# Patient Record
Sex: Male | Born: 1970
Health system: Southern US, Community
[De-identification: ages and names within clinical notes are randomized; demographics above are authoritative.]

## PROBLEM LIST (undated history)

## (undated) DIAGNOSIS — R569 Unspecified convulsions: Secondary | ICD-10-CM

## (undated) DIAGNOSIS — Z789 Other specified health status: Secondary | ICD-10-CM

## (undated) DIAGNOSIS — R011 Cardiac murmur, unspecified: Secondary | ICD-10-CM

## (undated) DIAGNOSIS — F4310 Post-traumatic stress disorder, unspecified: Secondary | ICD-10-CM

## (undated) DIAGNOSIS — F32A Depression, unspecified: Secondary | ICD-10-CM

## (undated) DIAGNOSIS — K5090 Crohn's disease, unspecified, without complications: Secondary | ICD-10-CM

## (undated) HISTORY — PX: CHOLECYSTECTOMY: SHX55

## (undated) HISTORY — PX: KNEE SURGERY: SHX244

## (undated) HISTORY — PX: ABDOMINAL SURGERY: SHX537

## (undated) HISTORY — PX: OTHER SURGICAL HISTORY: SHX169

---

## 1998-02-16 ENCOUNTER — Emergency Department (HOSPITAL_COMMUNITY): Admission: EM | Admit: 1998-02-16 | Discharge: 1998-02-16 | Payer: Self-pay | Admitting: Emergency Medicine

## 1998-02-16 ENCOUNTER — Encounter: Payer: Self-pay | Admitting: Emergency Medicine

## 2001-04-25 ENCOUNTER — Emergency Department (HOSPITAL_COMMUNITY): Admission: EM | Admit: 2001-04-25 | Discharge: 2001-04-25 | Payer: Self-pay | Admitting: *Deleted

## 2002-12-02 ENCOUNTER — Encounter: Payer: Self-pay | Admitting: Emergency Medicine

## 2002-12-02 ENCOUNTER — Emergency Department (HOSPITAL_COMMUNITY): Admission: EM | Admit: 2002-12-02 | Discharge: 2002-12-02 | Payer: Self-pay | Admitting: Emergency Medicine

## 2004-09-28 ENCOUNTER — Emergency Department (HOSPITAL_COMMUNITY): Admission: EM | Admit: 2004-09-28 | Discharge: 2004-09-29 | Payer: Self-pay | Admitting: Emergency Medicine

## 2005-05-29 ENCOUNTER — Emergency Department (HOSPITAL_COMMUNITY): Admission: EM | Admit: 2005-05-29 | Discharge: 2005-05-29 | Payer: Self-pay | Admitting: Emergency Medicine

## 2005-06-17 ENCOUNTER — Ambulatory Visit: Payer: Self-pay | Admitting: Family Medicine

## 2005-07-16 ENCOUNTER — Ambulatory Visit: Payer: Self-pay | Admitting: Family Medicine

## 2005-08-15 ENCOUNTER — Ambulatory Visit: Payer: Self-pay | Admitting: Family Medicine

## 2005-09-18 ENCOUNTER — Ambulatory Visit: Payer: Self-pay | Admitting: Family Medicine

## 2005-10-20 ENCOUNTER — Ambulatory Visit: Payer: Self-pay | Admitting: Family Medicine

## 2005-10-21 ENCOUNTER — Encounter
Admission: RE | Admit: 2005-10-21 | Discharge: 2006-01-19 | Payer: Self-pay | Admitting: Physical Medicine & Rehabilitation

## 2005-10-21 ENCOUNTER — Ambulatory Visit: Payer: Self-pay | Admitting: Physical Medicine & Rehabilitation

## 2006-01-09 ENCOUNTER — Ambulatory Visit: Payer: Self-pay | Admitting: Physical Medicine & Rehabilitation

## 2006-03-13 ENCOUNTER — Ambulatory Visit: Payer: Self-pay | Admitting: Physical Medicine & Rehabilitation

## 2006-03-13 ENCOUNTER — Encounter
Admission: RE | Admit: 2006-03-13 | Discharge: 2006-06-11 | Payer: Self-pay | Admitting: Physical Medicine & Rehabilitation

## 2006-04-17 ENCOUNTER — Ambulatory Visit: Payer: Self-pay | Admitting: Family Medicine

## 2007-07-29 ENCOUNTER — Ambulatory Visit: Payer: Self-pay | Admitting: Gastroenterology

## 2007-08-19 ENCOUNTER — Ambulatory Visit: Payer: Self-pay | Admitting: Gastroenterology

## 2007-08-20 ENCOUNTER — Ambulatory Visit: Payer: Self-pay | Admitting: Gastroenterology

## 2007-08-23 ENCOUNTER — Telehealth: Payer: Self-pay | Admitting: Gastroenterology

## 2007-08-24 ENCOUNTER — Ambulatory Visit (HOSPITAL_COMMUNITY): Admission: RE | Admit: 2007-08-24 | Discharge: 2007-08-24 | Payer: Self-pay | Admitting: Gastroenterology

## 2008-03-22 ENCOUNTER — Ambulatory Visit: Payer: Self-pay | Admitting: Orthopedic Surgery

## 2008-03-22 DIAGNOSIS — M25519 Pain in unspecified shoulder: Secondary | ICD-10-CM | POA: Insufficient documentation

## 2008-03-22 DIAGNOSIS — M24819 Other specific joint derangements of unspecified shoulder, not elsewhere classified: Secondary | ICD-10-CM | POA: Insufficient documentation

## 2008-04-10 ENCOUNTER — Ambulatory Visit (HOSPITAL_COMMUNITY): Admission: RE | Admit: 2008-04-10 | Discharge: 2008-04-10 | Payer: Self-pay | Admitting: Orthopedic Surgery

## 2008-04-14 ENCOUNTER — Encounter: Payer: Self-pay | Admitting: Orthopedic Surgery

## 2008-04-19 ENCOUNTER — Encounter (INDEPENDENT_AMBULATORY_CARE_PROVIDER_SITE_OTHER): Payer: Self-pay | Admitting: *Deleted

## 2008-04-19 ENCOUNTER — Ambulatory Visit: Payer: Self-pay | Admitting: Orthopedic Surgery

## 2008-09-29 ENCOUNTER — Emergency Department (HOSPITAL_COMMUNITY): Admission: EM | Admit: 2008-09-29 | Discharge: 2008-09-29 | Payer: Self-pay | Admitting: Emergency Medicine

## 2009-12-17 ENCOUNTER — Emergency Department (HOSPITAL_COMMUNITY): Admission: EM | Admit: 2009-12-17 | Discharge: 2009-12-17 | Payer: Self-pay | Admitting: Emergency Medicine

## 2010-05-16 NOTE — Miscellaneous (Signed)
Summary: rx med  Clinical Lists Changes  Medications: Added new medication of GLYCOPYRROLATE 2 MG  TABS (GLYCOPYRROLATE) take one tab twice a day. - Signed Rx of GLYCOPYRROLATE 2 MG  TABS (GLYCOPYRROLATE) take one tab twice a day.;  #60 x 5;  Signed;  Entered by: Darlyn Read RN;  Authorized by: Meryl Dare MD;  Method used: Electronic    Prescriptions: GLYCOPYRROLATE 2 MG  TABS (GLYCOPYRROLATE) take one tab twice a day.  #60 x 5   Entered by:   Darlyn Read RN   Authorized by:   Meryl Dare MD   Signed by:   Darlyn Read RN on 08/20/2007   Method used:   Electronically sent to ...       [facility]  [address]       [address]       [place], [place]  [address]       Ph: [phone] or [phone]       Fax: [phone]   RxID:   [id number]

## 2010-05-16 NOTE — Progress Notes (Signed)
Summary: REACTION TO MED  Phone Note Call from Patient Call back at [phone]   Caller: Spouse Reason for Call: Talk to Nurse Summary of Call: [facility] PT.  CALLER IS PAMELA.Marland KitchenPT'S SPOUSE.  ROBINUL IS CAUSING PT TO HAVE PANIC ATTACKS,NERVOUSNESS.  PATIENT'S CHART HAS BEEN REQUESTED  Initial call taken by: Magdalen Spatz [facility],  Aug 23, 2007 1:38 PM  Follow-up for Phone Call        Left message for patient to call back Zyshonne Malecha RN  Aug 23, 2007 1:46 PM  patients wife states that robinul is making him very nervous and having panic attacks. wondering if there is an alternate med.  Please advise. [facility] [place] Follow-up by: Darcey Nora RN,  Aug 23, 2007 2:01 PM  Additional Follow-up for Phone Call Additional follow up Details #1::        trial Levbid two times a day DC Robinul Additional Follow-up by: Meryl Dare MD,  Aug 23, 2007 2:48 PM    Additional Follow-up for Phone Call Additional follow up Details #2::    Meds called in , wife notified of ner orders Follow-up by: Darcey Nora RN,  Aug 23, 2007 3:02 PM  New/Updated Medications: LEVBID 0.375 MG  TB12 (HYOSCYAMINE SULFATE) 1 by mouth two times a day   Prescriptions: LEVBID 0.375 MG  TB12 (HYOSCYAMINE SULFATE) 1 by mouth two times a day  #60 x 3   Entered by:   Darcey Nora RN   Authorized by:   Meryl Dare MD   Signed by:   Darcey Nora RN on 08/23/2007   Method used:   Electronically sent to ...       [facility]  [address]       [address]       [place], [place]  [address]       Ph: [phone] or [phone]       Fax: [phone]   RxID:   [id number]

## 2010-05-16 NOTE — Letter (Signed)
Summary: Historic Patient File  Historic Patient File   Imported By: Elvera Maria 03/24/2008 11:52:12  _____________________________________________________________________  External Attachment:    Type:   Image     Comment:   history

## 2010-05-16 NOTE — Miscellaneous (Signed)
Summary: therapy order  Clinical Lists Changes  Orders: Added new Referral order of Physical Therapy Referral (PT) - Signed

## 2010-05-16 NOTE — Miscellaneous (Signed)
Summary: Rx for omeprazole  Clinical Lists Changes  Medications: Added new medication of OMEPRAZOLE 40 MG  CPDR (OMEPRAZOLE) 1 each day 30 minutes before meal - Signed Rx of OMEPRAZOLE 40 MG  CPDR (OMEPRAZOLE) 1 each day 30 minutes before meal;  #30 x 5;  Signed;  Entered by: Christie Nottingham CMA;  Authorized by: Meryl Dare MD;  Method used: Electronic Allergies: Added new allergy or adverse reaction of * FENTANYL PATCH Observations: Added new observation of NKA: F (08/19/2007 11:57)    Prescriptions: OMEPRAZOLE 40 MG  CPDR (OMEPRAZOLE) 1 each day 30 minutes before meal  #30 x 5   Entered by:   Christie Nottingham CMA   Authorized by:   Meryl Dare MD   Signed by:   Christie Nottingham CMA on 08/19/2007   Method used:   Electronically sent to ...       [facility]  [address]       [address]       [place], [place]  [address]       Ph: [phone] or [phone]       Fax: [phone]   RxID:   [id number]

## 2010-05-16 NOTE — Assessment & Plan Note (Signed)
Summary: MRI results from ap/frs    History of Present Illness: I saw Joshua Hale in the office today for a followup visit.  He is a 40 years old man with the complaint of:  right shoulder pain.  Patient states his shoulder is still bothering him.  MRI results.    IMPRESSION: 1. Negative for labral or rotator cuff tear. 2.  Mild acromioclavicular degenerative disease. 3.  Type 1, slightly downsloping acromion.   Read By:  Charyl Dancer,  M.D.     Released By:  Charyl Dancer,  M.D.     Current Allergies: ! * FENTANYL PATCH        Impression & Recommendations:  Problem # 1:  SHOULDER PAIN (ICD-719.41) Assessment: Unchanged The MRI was done at [facility] and it was reviewed with the report  No tear of the rotator cuff or ligament abnormality   Physical therapy for 8 weeks   His updated medication list for this problem includes:    Percocet 7.5-325 Mg Tabs (Oxycodone-acetaminophen) ..... One by mouth q 4 hrs prn    Mobic 15 Mg Tabs (Meloxicam)  Orders: Est. Patient Level II ([address])    Patient Instructions: 1)  no f/u necessary    ]

## 2010-05-16 NOTE — Procedures (Signed)
Summary: EGD   EGD  Procedure date:  08/20/2007  Findings:      Location: [facility]    Patient Name: Joshua, Hale MRN:  Procedure Procedures: Panendoscopy (EGD) CPT: 43235.  Personnel: Endoscopist: Venita Lick. Russella Dar, MD, Clementeen Graham.  Referred By: Joette Catching, MD.  Exam Location: Exam performed in Outpatient Clinic. Outpatient  Patient Consent: Procedure, Alternatives, Risks and Benefits discussed, consent obtained, from patient. Consent was obtained by the RN.  Indications Symptoms: Nausea. Vomiting. Abdominal pain, location: diffuse. Reflux symptoms  History  Current Medications: Patient is not currently taking Coumadin.  Pre-Exam Physical: Performed Aug 20, 2007  Cardio-pulmonary exam, HEENT exam WNL. Abdominal exam abnormal. Mental status exam WNL. Abnormal PE findings include: generalized abdominal tenderness to light palpation.  Comments: Pt. history reviewed/updated, physical exam performed prior to initiation of sedation?Yes Exam Exam Info: Maximum depth of insertion Duodenum, intended Duodenum. Vocal cords not visualized. Gastric retroflexion performed. ASA Classification: II. Tolerance: excellent.  Sedation Meds: Patient assessed and found to be appropriate for moderate (conscious) sedation. Benadryl 50mg  given IV. Versed 8 mg. given IV. Cetacaine Spray 2 sprays given aerosolized.  Monitoring: BP and pulse monitoring done. Oximetry used. Supplemental O2 given  Findings Normal: Proximal Esophagus to Distal Esophagus.  HIATAL HERNIA: Regular, 3 cms. in length. ICD9: Hernia, Hiatal: 553.3. Normal: Fundus to Duodenal 2nd Portion.   Assessment  Diagnoses: 553.3: Hernia, Hiatal.  530.81: GERD.   Events  Unplanned Intervention: No unplanned interventions were required.  Unplanned Events: There were no complications. Plans Medication(s): PPI: QAM,  Other: Robinul 2mg  BID,   Patient Education: Patient given standard instructions for:  Hiatal Hernia. Reflux.  Comments: TREAT FOR GERD AND FUNCTIONAL ABDOMINAL PAIN AND ASSESS RESPONSE Disposition: After procedure patient sent to recovery. After recovery patient sent home.  Scheduling: Gastric Emptying Study, next available appt       This report was created from the original endoscopy report, which was reviewed and signed the above listed endoscopist.

## 2010-05-16 NOTE — Assessment & Plan Note (Signed)
Summary: right shoulder pain.no xr/medicare/[place]qua[place]/BSF   Vital Signs:  Patient Profile:   40 Years Old Male Weight:      130 pounds Pulse rate:   68 / minute Resp:     16 per minute  Vitals Entered By: Joshua Canada MD (March 22, 2008 3:55 PM)                 Chief Complaint:  right shoulder pain.  History of Present Illness: I saw Joshua Hale in the office today for an initial visit.  He is a 40 years old man with the complaint of:  right shoulder pain, consult, Dr. Gerilyn Pilgrim.  Joshua Hale complains of RIGHT shoulder pain for 12 years. He reports several injuries including multiple dislocations which he relocates himself. The first dislocation occurred approximately 10 years ago when he was lifting a box. The emergency medical personnel put back in place.  He has had no shoulder surgery physical therapy or injection.  His shoulder pain is nonradiating and is unrelieved with Percocet 7.5 mg Mobic 15 mg and Valium 10 mg. Although these medicines are taken for other problems that he has a did not help her shoulder.        Updated Prior Medication List: OMEPRAZOLE 40 MG  CPDR (OMEPRAZOLE) 1 each day 30 minutes before meal GLYCOPYRROLATE 2 MG  TABS (GLYCOPYRROLATE) take one tab twice a day. LEVBID 0.375 MG  TB12 (HYOSCYAMINE SULFATE) 1 by mouth two times a day PERCOCET 7.5-325 MG TABS (OXYCODONE-ACETAMINOPHEN) one by mouth q 4 hrs prn VALIUM 10 MG TABS (DIAZEPAM)  MOBIC 15 MG TABS (MELOXICAM)   Current Allergies: ! * FENTANYL PATCH  Past Medical History:    Reviewed history and no changes required:       NA  Past Surgical History:    left knee over 10 surgeries salk, acl, mcKay procedure, was 40 years old and busted knee on rock doing chinups        left thumb reimplanted after amputation        gallbladder   Family History:    Family History Coronary Heart Disease male < 1    Family History of Arthritis    Hx, family, asthma  Social History:    Patient  is married.     disabled   Risk Factors:  Tobacco use:  current    Cigarettes:  Yes -- 1 pack(s) per day Caffeine use:  ? drinks per day Alcohol use:  no  Family History Risk Factors:    Family History of MI in males < 39 years old:  yes   Review of Systems  General      Denies weight loss, weight gain, fever, chills, and fatigue.  Cardiac      Denies chest pain, angina, heart attack, heart failure, poor circulation, blood clots, and phlebitis.  Resp      Denies short of breath, difficulty breathing, COPD, cough, and pneumonia.  GI      Complains of nausea and vomiting.      Denies diarrhea, constipation, difficulty swallowing, ulcers, GERD, and reflux.  GU      Complains of kidney stones.      Denies kidney failure, kidney transplant, burning, poor stream, testicular cancer, blood in urine, and .  Neuro      Complains of headache, dizziness, migraines, numbness, weakness, and unsteady walking.      Denies tremor.  MS      Complains of joint pain.  Denies rheumatoid arthritis, joint swelling, gout, bone cancer, osteoporosis, and .  Endo      Denies thyroid disease, goiter, and diabetes.  Psych      Denies depression, mood swings, anxiety, panic attack, bipolar, and schizophrenia.  Derm      Denies eczema, cancer, and itching.  EENT      Complains of poor vision.      Denies cataracts, glaucoma, poor hearing, vertigo, ears ringing, sinusitis, hoarseness, toothaches, and bleeding gums.  Immunology      Complains of seasonal allergies, sinus problems, and allergic to bee stings.  Lymphatic      Denies lymph node cancer and lymph edema.   Physical Exam  Skin:     intact without lesions or rashes Cervical Nodes:     no significant adenopathy Psych:     alert and cooperative; normal mood and affect; normal attention span and concentration   Shoulder/Elbow Exam  General:    Well-developed, well-nourished, normal body habitus; no deformities,  normal grooming.    Skin:    Intact, no scars, lesions, rashes, cafe au lait spots or bruising.    Inspection:    Inspection is normal.    Palpation:    there is tenderness over the anterior lateral RIGHT shoulder posterior joint line is nontender the a.c. joint is nontender  Vascular:    Radial, ulnar, brachial, and axillary pulses 2+ and symmetric; capillary refill less than 2 seconds; no evidence of ischemia, clubbing, or cyanosis.    Sensory:    Gross sensation intact in the upper extremities.    Motor:    Normal strength in the upper extremities.    Reflexes:    Normal reflexes in the upper extremities.    Shoulder Exam:    Right:    Inspection:  Normal    Palpation:  Abnormal    there is increased translation in abduction and external rotation.  The range of motion in the shoulders normal.    Left:    Inspection/Palpation:  normal range of motion LEFT shoulder  Impingement Sign NEER:    Right negative; Left negative Impingement Sign HAWKINS:    Right negative; Left negative Apprehension Sign:    Right positive; Left negative Relocation Test:    Right positive; Left negative Sulcus Sign:    Right negative; Left negative OBRIEN'S Test:    Right negative; Left negative Yerguson:    Right negative Speeds:    Right negative AC joint Adduction Test:    Right negative FLEX-IR POST STRESS Test:    Right negative    Impression & Recommendations:  Problem # 1:  SHOULDER INSTABILITY (ICD-718.81)  Orders: New Patient Level III (99371) Shoulder x-ray,  minimum 2 views (73030) normal bony anatomy, normal glenohumeral joint, acromion is type II.  Impression normal x-rays of the shoulder     Problem # 2:  SHOULDER PAIN (ICD-719.41)  His updated medication list for this problem includes:    Percocet 7.5-325 Mg Tabs (Oxycodone-acetaminophen) ..... One by mouth q 4 hrs prn    Mobic 15 Mg Tabs (Meloxicam)  Orders: New Patient Level III (69678) Shoulder  x-ray,  minimum 2 views (93810)  He has some increased translation in the RIGHT shoulder with a clicking sound noted in abduction external rotation. There appears to be some instability here.  He has not had physical therapy he has not had an MRI. He should be done first. We will order the MRI. We will have to get his medications  from his memory care physician we will not prescribe any Percocet.  Medications Added to Medication List This Visit: 1)  Percocet 7.5-325 Mg Tabs (Oxycodone-acetaminophen) .... One by mouth q 4 hrs prn 2)  Valium 10 Mg Tabs (Diazepam) 3)  Mobic 15 Mg Tabs (Meloxicam)   Patient Instructions: 1)  MRI arthrogram right shoulder for instability    ]

## 2010-05-16 NOTE — Miscellaneous (Signed)
Summary: Rehab Report  Rehab Report   Imported By: Elvera Maria 06/09/2008 09:20:15  _____________________________________________________________________  External Attachment:    Type:   Image     Comment:   plan of care

## 2010-07-11 ENCOUNTER — Emergency Department (HOSPITAL_COMMUNITY): Payer: Medicare Other

## 2010-07-11 ENCOUNTER — Emergency Department (HOSPITAL_COMMUNITY)
Admission: EM | Admit: 2010-07-11 | Discharge: 2010-07-11 | Disposition: A | Discharge status: Home / Self Care | Payer: Medicare Other | Attending: Emergency Medicine | Admitting: Emergency Medicine

## 2010-07-11 DIAGNOSIS — S6000XA Contusion of unspecified finger without damage to nail, initial encounter: Secondary | ICD-10-CM | POA: Insufficient documentation

## 2010-07-11 DIAGNOSIS — Y92009 Unspecified place in unspecified non-institutional (private) residence as the place of occurrence of the external cause: Secondary | ICD-10-CM | POA: Insufficient documentation

## 2010-07-11 DIAGNOSIS — IMO0002 Reserved for concepts with insufficient information to code with codable children: Secondary | ICD-10-CM | POA: Insufficient documentation

## 2010-08-27 NOTE — Assessment & Plan Note (Signed)
[facility]                         GASTROENTEROLOGY OFFICE NOTE   NAME:Joshua Hale, Joshua Hale                           MRN:          [id number]  DATE:08/19/2007                            DOB:          1970-12-20    REASON FOR REFERRAL:  Chronic abdominal pain and vomiting.   HISTORY OF PRESENT ILLNESS:  This is a 39 year old white male who  relates intermittent problems with generalized abdominal pain, gas, and  bloating since childhood.  He states over the past few months he has had  worsening pain with intermittent nausea and vomiting, heartburn, and  indigestion.  He states he has tried various acid medications but does  not recall which ones.  Records from Dr. Lysbeth Galas indicate he has tried a  course of Protonix.  The patient states these medications were not  effective.  He has chronic back and joint pain.  He also takes Mobic on  a daily basis and Advil on a p.r.n. basis.  A recent CBC, C-MET, and  Helicobacter pylori antibody were all unremarkable.  An abdominal and  pelvic CT scan without intravenous contrast was performed on May 31, 2007 at [facility] showed a small hiatal hernia but no other  abnormalities.  He relates no dysphagia, odynophagia, change in bowel  habits, melena, hematochezia, or hematemesis.  He relates an aunt and a  grandmother with either colitis or Crohn disease but he is not sure.  No  family members with colon cancer, colon polyps, or inflammatory bowel  disease.   PAST MEDICAL HISTORY:  1. Chronic back pain.  2. Chronic pain syndrome.   PAST SURGICAL HISTORY:  1. Abdominal surgery at age 92 months-suspect this was bilateral      inguinal hernia repairs.  2. Status post laparoscopic cholecystectomy in 1999.  3. Status post left knee surgery x10.  4. Status post left thumb reattachment.   CURRENT MEDICATIONS:  Listed on the chart   MEDICATION ALLERGIES:  FENTANYL PATCH.   SOCIAL HISTORY:  Per the handwritten  form.   REVIEW OF SYSTEMS:  Per the handwritten form.   PHYSICAL EXAMINATION:  GENERAL:  A chronically ill-appearing,  uncomfortable-appearing, white male.  VITAL SIGNS:  Height 5 feet 6 inches, weight 142.8 pounds, blood  pressure 108/62, pulse 72 and regular.  HEENT:  Anicteric sclerae.  Oropharynx is clear.  NECK:  Without thyromegaly or adenopathy appreciated.  CHEST:  Clear to auscultation bilaterally.  CARDIAC:  Regular rate and rhythm without murmurs appreciated.  ABDOMEN:  Soft with generalized tenderness to light palpation diffusely.  No rebound or guarding.  No palpable organomegaly, masses, or hernias.  No distention.  Surgical incisions consistent with laparoscopic  cholecystectomy and bilateral inguinal hernia repairs.  EXTREMITIES:  Without clubbing, cyanosis, or edema.  NEUROLOGIC:  Alert and oriented x3.  Grossly nonfocal.   ASSESSMENT:  Chronic generalized abdominal pain with recent nausea and  vomiting and heartburn rule out ulcer disease secondary to Mobic and  Advil, rule out gastroesophageal reflux disease, rule out gastroparesis,  rule out functional vomiting.  His chronic abdominal complaints appear  likely to be functional in nature.  Prior cholecystectomy. Probable  prior bilateral inguinal hernia repairs.   PLAN:  1. Consider colonoscopy to complete the evaluation once his upper      gastrointestinal tract symptoms have improved.  2. Begin omeprazole 40 mg p.o. every morning along with antireflux      measures.  3. Obtain records from prior testing at [facility].  4. Risks, benefits, and alternatives to upper endoscopy with possible      biopsy discussed with the patient and he consents to proceed.  This      will be scheduled electively.     Venita Lick. Russella Dar, MD, [facility]  Electronically Signed    MTS/MedQ  DD: 08/20/2007  DT: 08/20/2007  Job #: [id number]   cc:   Delaney Meigs, M.D.

## 2010-08-30 NOTE — Group Therapy Note (Signed)
PURPOSE OF EVALUATION:  Evaluate and treat chronic hand, back and knee pain.   REFERRAL:  [facility], [place], Dr. Lysbeth Galas  and Verlan Friends.   HISTORY OF PRESENT ILLNESS:  Joshua Hale is a 40 year old adult male referred  to this office by his primary care physician as noted above for evaluation  and treatment of chronic left knee, left hand and low back pain.   The patient reports that he began experiencing knee problems 23 years ago  and has had a total of 10 knee operations on the left side.  He reports that  he has been told that he is not a candidate for total knee replacement.  He  has chronic pain in that limb and uses a knee brace.  He also uses a single  point cane for ambulation because of left knee pain.   He reports that his back pain has been present for the past four years or  so.  He has been told that he has degenerative changes in his back.  He also  reports that he has had left hand pain since 1998 when he had a work injury  which resulted in an amputation of his left thumb.  That was subsequently  reattached in [place] and he has had persistent pain in that left  thumb since that time.   The patient has been treated by his primary care physician including use of  Percocet at 7.5/325 used three times a day.  He reports that he has been on  that medication for approximately two years, previously prescribed by Dr.  Olena Leatherwood in [place] who is his primary care physician previously.   The patient underwent MRI scan of his lumbar spine approximately April 2007.  That showed no significant pathology.  He did see Dr. [name], a local  orthopedist.  Dr. [name] did not feel this leg pain was related to his  spine.  He referred the patient back to his primary care physician at that  time.  The patient understands that Dr. [name] did not feel that total knee  replacement was appropriate.   On Aug 15, 2005, the patient followed up with [name]  in Dr. Joyce Copa  office.  At that time, the patient refused trigger point injections.  He  wanted a referral to a chronic pain clinic.  They refilled his Percocet  10.5/500 t.i.d. p.r.n. along with Naprosyn 500 mg b.i.d. They plan to refer  him to a pain clinic in [place].   September 23, 2005, the patient again saw [name] through Dr. Joyce Copa office.  At that time again, he requested a pain clinic referral.  They refilled his  Percocet 7.5/500 1 tablet t.i.d.  They started Soma in place of Flexeril at  350 mg 1-2 tablets p.o. q.h.s. and added Mobic at 7.5 mg b.i.d. in place of  Naprosyn.   Presently, the patient reports that not much is helping his knee pain.  He  had minimally relief from the Percocet, Mobic and Soma for his knee.  He  reports that the three medications do help his back and hand pain to some  degree.  He would like to have a better quality of life as he is unable to  participate with activities with his children secondary to his persistent  knee pain.  He has previously been prescribed Flexeril, Darvocet, Vicodin  and Percocet.  He has not used Lidoderm patch nor any Duragesic patches in  the past.  PAST MEDICAL HISTORY:  1.  Known history of 10 left knee operations.  2.  History of left thumb amputation with subsequent reimplantation.  3.  Prior cholecystectomy.   ALLERGIES:  No known drug allergies.   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  He is married with three children who are all in the  home.  He is trying for disability at the present time.  He smokes one and a  half packs per day, down from four packs of cigarettes per day.  He denies  alcohol usage.  He reports that he smokes marijuana approximately once to  twice per month.   ALLERGIES:  No known drug allergies.   MEDICATIONS:  1.  Oxycodone 7.5/500 1 tablet t.i.d. p.r.n.  2.  Zyrtec 10 mg daily.  3.  Mobic 7.5 mg b.i.d.  4.  Soma  350 mg 1-2 tablets p.o. daily p.r.n.  5.  Advil p.r.n. for headaches.   PHYSICAL EXAMINATION:  A reasonably well-appearing, small-framed adult male.  Height 5 foot 6 inches.  Weight 145 pounds.  Blood pressure 151/81 with a  pulse of 84, respiratory rate 16 and O2 saturation 97% on room air.  He is  pleasant and cooperative and accompanied by his wife in the office today.  The patient ambulates with a single-point cane with a knee brace on his left  knee.  He is able to ambulate with and without the brace and with and  without the cane.  Upper extremity range of motion was full and painfree.  Upper extremity exam showed 5/5 strength throughout the right entire upper  extremity and mostly 5/5 strength throughout the left upper extremity with  exception of his grip which was approximately 4+/5 on the left.  Lumbar  range of motion was decreased in all planes in the standing position.   Lower extremity exam showed 5/5 strength in hip flexion bilaterally.  Bulk  was slightly decreased in the left leg compared to the right.  Knee  extension was 5/5 on the right and 4/5 on the left.  He has well-healed  scars throughout his left mid-leg region.   IMPRESSION:  History of chronic left knee, left hand and lumbar pain.   Presently, the patient reports that he gets minimal relief from his pain  medicines, especially in regard to his left knee pain.  With that problem,  we have decided to add Duragesic patch at 25 mcg per hour, change q.72h.  That will be in addition to his Mobic, Soma and Percocet that he is already  using.  I have asked them to have all the pain medicines filled through this  office.  They have a sufficient supply of the Soma, Mobic and Percocet at  this point but understand that all refills will be through this office.  The  patient understands that he needs to comply with all regulations and call in for refills as prescribed and not lose the prescription or pain medicines.  He  already has been having his wife pick up the Percocet for him at the  other office so that should not be a change for him.  He will need to have  his wife pick up the Duragesic patch at the same time that they pick up the  Percocet in the future.  We will make adjustments to the Duragesic patch as  necessary depending on his medication and benefit that he receives.  At the  present time, we have not changed any of his medicines and asked him to  continue on the same dose of Percocet used up to three times per day.   We will plan on seeing the patient in follow up in the office in  approximately one months' time.           ______________________________  Ellwood Dense, M.D.     DC/MedQ  D:  10/22/2005 16:24:20  T:  10/22/2005 22:26:49  Job #:  [id number]

## 2010-08-30 NOTE — Assessment & Plan Note (Signed)
Joshua Hale returns to the clinic today for followup evaluation. When I last  saw the patient November 12, 2005 we switched him from a Duragesic patch at 25  mcg per hour to oxycodone sustained release at 20 mg twice a day. He was  experiencing vomiting along with sputtering, sweating, and painful  urination, and he felt that was secondary to the Duragesic. He reports that  all of those symptoms have resolved. He is using the extended release  medication along with the Percocet on an as needed basis and getting fair  amount of relief. He still reports some recent falls when his left knee  gives out despite using the left knee brace. He also reports that he is  having some weakness involving his right knee, but there is not much pain  involving that joint at the present time.   The patient reports that he is being treated for something similar to  glaucoma as he is losing some eyesight on the right side.   The patient does report that he used marijuana this past weekend. He reports  that he had trouble getting in on the phone line for refill on his medicines  and decided to use marijuana in place. He has had two recent drug screens  that have been positive for marijuana, and we have warned him that he may be  discharged from the clinic if he continues to show positive for that drug.   MEDICATIONS:  1. Oxycodone 7.5/500 one tablet t.i.d. p.r.n. (three per day).  2. Zyrtec 10 mg daily.  3. Mobic 7.5 mg b.i.d.  4. Soma 350 mg one tablet b.i.d. p.r.n.  5. Advil p.r.n.  6. Xanax 0.5 mg q.h.s. p.r.n.   REVIEW OF SYSTEMS:  Positive for weight loss, night sweats, nausea,  vomiting, painful urination, urinary retention, poor appetite, limb  swelling, and coughing.   PHYSICAL EXAMINATION:  GENERAL:  A reasonably well-appearing, middle-aged  adult male with brace on his left lower extremity accompanied by his wife.  VITAL SIGNS:  Blood pressure 124/85, pulse 104, __________ 17, O2 saturation  97%  on room air. He has 5/5 strength in the left upper extremity, and right  extremity strength was 5/5. Lower extremity strength was 5/5. Hip flexion  4+/5 in the extension on the right and 4-/5 in knee strength on the left.   IMPRESSION:  History of chronic left knee, left hand, and lumbar pain.   In the office today we did obtain another urine drug screen for him. He  reports that he understands that if he continues to show positive for  marijuana that he will be discharged from this clinic. He continues to get  reasonable relief from the pain medicines. I told him that all other  patients have no problem getting in for refills through the call-in line. I  expect that he would be able to get in as scheduled. There is some problem  with his medicines in terms of his Medicaid. Apparently they need frequent  approval from his Medicaid to get the prescribed medicines. We will try to  help him with that as best we can. If I try to use weaker medicines that do  not need the approval of Medicaid, he will probably not gain quick relief.   PLAN:  Will plan on seeing the patient in followup in this office in  approximately two months time. He reports that he will be clean of marijuana  when he is next seen.  ______________________________  Ellwood Dense, M.D.     DC/MedQ  D:  01/12/2006 12:48:23  T:  01/13/2006 09:54:16  Job #:  [id number]

## 2010-08-30 NOTE — Assessment & Plan Note (Signed)
Joshua Hale returns to clinic today accompanied by his wife.  I first and last  saw the patient in this office October 22, 2005, on evaluation from Dr. Lysbeth Galas  and Verlan Friends for evaluation of chronic back, hand, and knee pain.  When we  saw the patient last, we had obtained a urine drug screen and he was  positive for benzodiazepines, marijuana, and opiates.  He had reported that  he was still using marijuana approximately once to twice per month.  He was  also on oxycodone and that coincides with the positive opiates.  He did not  tell me about the benzodiazepines, but apparently he has used some Xanax on  an occasional basis and that is a sleeping medicine that his wife has been  using also.   In terms of his pain medicines, during the last clinic visit, we had added a  Duragesic patch at 25 mcg per hour change q.72h.  He reports that he is  experiencing side effects including vomiting, sweating, stuttering, painful  urination, swelling, and coughing from the Duragesic.  He has stopped that  and all those symptoms have resolved.  He is wanting to try a different slow  release medicine in addition to his p.r.n. Percocet and Soma medications.  He still complains of neuropathic pain of the entire left lower extremity  which is on a periodic basis.   MEDICATIONS:  1.  Oxycodone 7.5/500, one tablet t.i.d. p.r.n.  2.  Zyrtec 10 mg daily.  3.  Mobic 7.5 mg b.i.d.  4.  Soma 350 mg 1-2 tablets p.o. daily p.r.n.  5.  Advil p.r.n.  6.  Xanax 0.5 mg q.h.s. p.r.n. (wife's medicine).   REVIEW OF SYSTEMS:  Positive for night sweats, weight gain, nausea,  vomiting, painful urination, urinary retention, abdominal pain, poor  appetite, limb swelling, coughing.   PHYSICAL EXAMINATION:  A well appearing middle aged adult male who appears slightly older than his  stated age.  Blood pressure 126/78 with a pulse of 89, respiratory rate 16,  and O2 saturation 97% on room air.  He has 5/5 strength with the  left upper  extremity with the exception of grip which was 4+/5.  Right upper extremity  strength was 5/5.  Lower extremity strength was 5/5 in hip flexion and 4 to  4+/5 in knee extension bilaterally.   IMPRESSION:  History of chronic left knee, left hand, and lumbar pain.   In the office today, we did have the patient stop the Duragesic completely.  In place of that, we started him on Oxycodone extended release 20 mg twice a  day.  We also refilled his Soma along with his Mobic medications.  His  Percocet was also refilled in a generic form.  We also gave him a small bit  of Xanax at 0.5 mg q.h.s. p.r.n.  We will plan on seeing the patient in  follow up in this office in approximately two months time with refills prior  to that appointment as necessary.  We did obtain another urine drug screen  in the office today.  He reports that he has not used any marijuana over the  past month or so, although he had used his wife's Xanax medication recently.  I told him that we will continue to prescribe pain medicines if his urine  drug screen is negative for marijuana as we are expecting that his pain  control will be obtained with prescription medicines instead of illicit  drugs.  ______________________________  Ellwood Dense, M.D.     DC/MedQ  D:  11/12/2005 15:54:08  T:  11/12/2005 78:29:56  Job #:  [id number]

## 2010-08-30 NOTE — Assessment & Plan Note (Signed)
[facility] NOT[facility]:   PHYSICAL MEDICINE AND REHABILITATION OFFICE NOTES:  Joshua Hale  Monday, March 16, 2006   HISTORY OF PRESENT ILLNESS:  [name] returns to the clinic today for  follow up evaluation.  I last saw him in this office January 12, 2006.  At that time, we did a urine drug screen which came back inconsistent  for various agents.  He was again positive for marijuana as he had been  on the prior evaluation done November 12, 2005.  He was also coming back  inconsistent for certain benzodiazepines which he was not prescribed,  and also negative for certain benzodiazepines that he was prescribed.  These results were sent to him by mail.  We will be planning to repeat a  urine drug screen in the office today.  If he continues to have  inconsistencies, especially involving the marijuana, he will have to be  released from this office.   The patient has been using the oxycodone, extended release 20 mg b.i.d.  He also used the Percocet approximately three tablets per day.  He  reports poor sleep initiating the Xanax 0.5 mg p.r.n.  He also reports  that he fell approximately March 04, 2006.  He as initially seen at  [facility] emergency room and then was transferred to [facility] emergency  room after a CT scan was done.  The patient reports that he did not like  the treatment that he was getting at [facility], and he  subsequently left, as they were planning to do an MRI scan of his back  according to his report.   MEDICATIONS:  1. Oxycodone 7.5/500 one tablet t.i.d. p.r.n.  2. Oxycodone extended release 20 mg b.i.d.  3. Zyrtec 10 mg daily.  4. Mobic 7.5 mg b.i.d.  5. Soma 350 mg one tablet b.i.d. p.r.n.  6. Advil p.r.n.  7. Xanax 0.5 mg q.h.s. p.r.n.   REVIEW OF SYSTEMS:  Positive for poor appetite, limb swelling, night  sweats and weight loss.   PHYSICAL EXAMINATION:  GENERAL:  Ill-appearing, middle aged adult male  in moderate acute discomfort,  using crutches for ambulation.  He also  has a brace on his left lower extremity at knee level.  VITAL SIGNS:  Blood pressure is 116/82, pulse 100, respirations 16, O2  saturation 98% on room air.  EXTREMITIES:  He has 5/5 strength throughout the bilateral upper  extremities and lower extremity strength was 4-/5 bilaterally.   IMPRESSION:  History of chronic left knee pain, left hand and lumbar  pain.   PLAN:  In the office today, we did refill his Xanax to 0.5 mg to use one  tablet q.h.s. p.r.n.  I told him that we need to obtain a urine drug  screen in the office today.  If that urine drug screen is positive for  nonprescribed medications or negative for prescribed medications, will  need to release him.  Will also be checking for marijuana.  He  previously told me that he was clean, but his last urine drug screen  from October 1 came back positive for  marijuana.  We will plan on seeing the patient in follow up in  approximately two months time if his urine drug screen is as expected.           ______________________________  Ellwood Dense, M.D.     DC/MedQ  D:  03/16/2006 12:57:18  T:  03/17/2006 32:44:01  Job #:  [id number]

## 2010-11-21 ENCOUNTER — Emergency Department (HOSPITAL_COMMUNITY)
Admission: EM | Admit: 2010-11-21 | Discharge: 2010-11-22 | Disposition: A | Discharge status: Home / Self Care | Payer: Medicare Other | Attending: Emergency Medicine | Admitting: Emergency Medicine

## 2010-11-21 ENCOUNTER — Encounter: Payer: Self-pay | Admitting: *Deleted

## 2010-11-21 ENCOUNTER — Emergency Department (HOSPITAL_COMMUNITY): Payer: Medicare Other

## 2010-11-21 DIAGNOSIS — R21 Rash and other nonspecific skin eruption: Secondary | ICD-10-CM | POA: Insufficient documentation

## 2010-11-21 DIAGNOSIS — IMO0001 Reserved for inherently not codable concepts without codable children: Secondary | ICD-10-CM | POA: Insufficient documentation

## 2010-11-21 DIAGNOSIS — F17200 Nicotine dependence, unspecified, uncomplicated: Secondary | ICD-10-CM | POA: Insufficient documentation

## 2010-11-21 LAB — DIFFERENTIAL
Basophils Absolute: 0 K/uL (ref 0.0–0.1)
Basophils Relative: 0 % (ref 0–1)
Eosinophils Absolute: 0.4 K/uL (ref 0.0–0.7)
Eosinophils Relative: 4 % (ref 0–5)
Lymphocytes Relative: 33 % (ref 12–46)
Lymphs Abs: 3.1 K/uL (ref 0.7–4.0)
Monocytes Absolute: 0.5 K/uL (ref 0.1–1.0)
Monocytes Relative: 5 % (ref 3–12)
Neutro Abs: 5.3 K/uL (ref 1.7–7.7)
Neutrophils Relative %: 57 % (ref 43–77)

## 2010-11-21 LAB — CBC
HCT: 43.4 % (ref 39.0–52.0)
Hemoglobin: 14.5 g/dL (ref 13.0–17.0)
MCH: 30.6 pg (ref 26.0–34.0)
MCHC: 33.4 g/dL (ref 30.0–36.0)
MCV: 91.6 fL (ref 78.0–100.0)
Platelets: 254 K/uL (ref 150–400)
RBC: 4.74 MIL/uL (ref 4.22–5.81)
RDW: 13.9 % (ref 11.5–15.5)
WBC: 9.3 K/uL (ref 4.0–10.5)

## 2010-11-21 LAB — COMPREHENSIVE METABOLIC PANEL WITH GFR
ALT: 9 U/L (ref 0–53)
AST: 14 U/L (ref 0–37)
Albumin: 3.5 g/dL (ref 3.5–5.2)
Alkaline Phosphatase: 104 U/L (ref 39–117)
BUN: 7 mg/dL (ref 6–23)
CO2: 25 meq/L (ref 19–32)
Calcium: 8.9 mg/dL (ref 8.4–10.5)
Chloride: 105 meq/L (ref 96–112)
Creatinine, Ser: 0.83 mg/dL (ref 0.50–1.35)
GFR calc Af Amer: >60 mL/min
GFR calc non Af Amer: >60 mL/min
Glucose, Bld: 92 mg/dL (ref 70–99)
Potassium: 4.2 meq/L (ref 3.5–5.1)
Sodium: 142 meq/L (ref 135–145)
Total Bilirubin: 0.2 mg/dL — ABNORMAL LOW (ref 0.3–1.2)
Total Protein: 6.5 g/dL (ref 6.0–8.3)

## 2010-11-21 LAB — URINALYSIS, ROUTINE W REFLEX MICROSCOPIC
Bilirubin Urine: NEGATIVE
Glucose, UA: NEGATIVE mg/dL
Hgb urine dipstick: NEGATIVE
Ketones, ur: NEGATIVE mg/dL
Leukocytes, UA: NEGATIVE
Nitrite: NEGATIVE
Protein, ur: NEGATIVE mg/dL
Specific Gravity, Urine: <1.005 — ABNORMAL LOW (ref 1.005–1.030)
Urobilinogen, UA: 0.2 mg/dL (ref 0.0–1.0)
pH: 5.5 (ref 5.0–8.0)

## 2010-11-21 LAB — PROTIME-INR
INR: 1 (ref 0.00–1.49)
Prothrombin Time: 13.4 s (ref 11.6–15.2)

## 2010-11-21 LAB — APTT: aPTT: 30 seconds (ref 24–37)

## 2010-11-21 MED ORDER — SODIUM CHLORIDE 0.9 % IV BOLUS (SEPSIS)
1000.0000 mL | Freq: Once | INTRAVENOUS | Status: AC
  Administered 2010-11-21: 1000 mL via INTRAVENOUS

## 2010-11-21 NOTE — ED Provider Notes (Signed)
History     CSN: [id number] Arrival date & time: 11/21/2010  7:42 PM  Chief Complaint  Patient presents with  . Rash   HPI Comments: Patient c/o gradual onset of rash that began 3-4 days ago on the left upper arm and now spread to most of his body.  He also c/o muscles aches, and increasing fatigue since onset of the rash.  Denies new medications, chemical exposure, fever, vomiting, headache, neck pain or itching.  No other family members with rashes or illness.    Patient is a 40 y.o. male presenting with rash. The history is provided by the patient.  Rash  This is a new problem. The current episode started more than 2 days ago. The problem has been gradually worsening. The problem is associated with nothing. There has been no fever. The rash is present on the back, torso, face, groin, left ear, left arm, left hand, right hand, right arm, right lower leg, right upper leg, left upper leg and left lower leg. The pain is mild. The pain has been constant since onset. Associated symptoms include blisters and pain. Pertinent negatives include no itching and no weeping. He has tried nothing for the symptoms. Risk factors: unknown.    History reviewed. No pertinent past medical history.  Past Surgical History  Procedure Date  . Cholecystectomy   . Knee surgery   . Thumb surgery     Family History  Problem Relation Age of Onset  . Hypertension Mother   . Heart failure Mother   . COPD Mother     History  Substance Use Topics  . Smoking status: Current Everyday Smoker -- 1.0 packs/day  . Smokeless tobacco: Never Used  . Alcohol Use: No      Review of Systems  Constitutional: Positive for activity change and fatigue. Negative for fever, chills and appetite change.  HENT: Negative for sore throat, facial swelling, drooling, trouble swallowing, neck pain and neck stiffness.   Eyes: Negative for pain and visual disturbance.  Respiratory: Negative for chest tightness and shortness of  breath.   Cardiovascular: Negative.   Gastrointestinal: Negative.   Genitourinary: Negative for dysuria, hematuria and decreased urine volume.  Musculoskeletal: Positive for myalgias. Negative for back pain, joint swelling and gait problem.  Skin: Positive for rash. Negative for itching.  Neurological: Negative for dizziness, facial asymmetry, weakness, numbness and headaches.  Hematological: Negative for adenopathy. Does not bruise/bleed easily.  Psychiatric/Behavioral: Negative for decreased concentration.    Physical Exam  BP 104/68  Pulse 70  Temp(Src) 97.9 F (36.6 C) (Oral)  Resp 18  Ht 5\' 6"  (1.676 m)  Wt 150 lb (68.04 kg)  BMI 24.21 kg/m2  SpO2 98%  Physical Exam  Nursing note and vitals reviewed. Constitutional: He is oriented to person, place, and time. He appears well-developed. He appears distressed.  HENT:  Head: Normocephalic and atraumatic.  Mouth/Throat: Oropharynx is clear and moist.  Eyes: Pupils are equal, round, and reactive to light.  Neck: Normal range of motion. No thyromegaly present.  Cardiovascular: Normal rate, regular rhythm and normal heart sounds.   Pulmonary/Chest: Effort normal and breath sounds normal. No respiratory distress. He exhibits tenderness.  Abdominal: Soft. There is no tenderness. There is no rebound and no guarding.  Musculoskeletal: He exhibits tenderness. He exhibits no edema.  Lymphadenopathy:    He has no cervical adenopathy.  Neurological: He is alert and oriented to person, place, and time. No cranial nerve deficit. He exhibits normal muscle tone. Coordination  normal.  Skin: Skin is warm and dry. Lesion and rash noted. No bruising, no ecchymosis, no laceration, no petechiae and no purpura noted. Rash is macular. Rash is not nodular, not vesicular and not urticarial. There is erythema.    ED Course  Procedures  MDM   2110  Patient was seen by EDP.  Diffuse macular, scabbed lesions to the bilateral upper and lower  extremities, chest face and back.  Feet and palms are spared.  No edema, no obvious blisters or drainage.  Care plan was discussed and further care of the patient to be managed by the EDP.        Greogory Cornette L. [place], [place] 11/21/10 2126

## 2010-11-21 NOTE — ED Notes (Signed)
Pt c/o rash over most of left arm areas on right hand and right leg.

## 2010-11-21 NOTE — ED Notes (Addendum)
SEEN BY ME AND ETIOLOGY OF RASH CONCERN FOR SYSTEMIC PROCESS. WILL GET LABS AND REVIEW AND MAY NEED TO ADMITTED.   Medical screening examination/treatment/procedure(s) were conducted as a shared visit with non-physician practitioner(s) and myself.  I personally evaluated the patient during the encounter   IN ED LABS NORMAL AND REPEAT VITALS NORMAL WILL RX WIT DOXY THINKING MAY BE MRSA RELATED. NOT TOXIC IN NAD. WILL FOLLOW UP ON Saturday IN ED. WILL RETURN IF WORSE AT ALL.   Results for orders placed during the hospital encounter of 11/21/10  CBC      Component Value Range   WBC 9.3  4.0 - 10.5 (K/uL)   RBC 4.74  4.22 - 5.81 (MIL/uL)   Hemoglobin 14.5  13.0 - 17.0 (g/dL)   HCT 16.1  09.6 - 04.5 (%)   MCV 91.6  78.0 - 100.0 (fL)   MCH 30.6  26.0 - 34.0 (pg)   MCHC 33.4  30.0 - 36.0 (g/dL)   RDW 40.9  81.1 - 91.4 (%)   Platelets 254  150 - 400 (K/uL)  DIFFERENTIAL      Component Value Range   Neutrophils Relative 57  43 - 77 (%)   Neutro Abs 5.3  1.7 - 7.7 (K/uL)   Lymphocytes Relative 33  12 - 46 (%)   Lymphs Abs 3.1  0.7 - 4.0 (K/uL)   Monocytes Relative 5  3 - 12 (%)   Monocytes Absolute 0.5  0.1 - 1.0 (K/uL)   Eosinophils Relative 4  0 - 5 (%)   Eosinophils Absolute 0.4  0.0 - 0.7 (K/uL)   Basophils Relative 0  0 - 1 (%)   Basophils Absolute 0.0  0.0 - 0.1 (K/uL)  COMPREHENSIVE METABOLIC PANEL      Component Value Range   Sodium 142  135 - 145 (mEq/L)   Potassium 4.2  3.5 - 5.1 (mEq/L)   Chloride 105  96 - 112 (mEq/L)   CO2 25  19 - 32 (mEq/L)   Glucose, Bld 92  70 - 99 (mg/dL)   BUN 7  6 - 23 (mg/dL)   Creatinine, Ser 7.82  0.50 - 1.35 (mg/dL)   Calcium 8.9  8.4 - 95.6 (mg/dL)   Total Protein 6.5  6.0 - 8.3 (g/dL)   Albumin 3.5  3.5 - 5.2 (g/dL)   AST 14  0 - 37 (U/L)   ALT 9  0 - 53 (U/L)   Alkaline Phosphatase 104  39 - 117 (U/L)   Total Bilirubin 0.2 (*) 0.3 - 1.2 (mg/dL)   GFR calc non Af Amer >60  >60 (mL/min)   GFR calc Af Amer >60  >60 (mL/min)  PROTIME-INR        Component Value Range   Prothrombin Time 13.4  11.6 - 15.2 (seconds)   INR 1.00  0.00 - 1.49   APTT      Component Value Range   aPTT 30  24 - 37 (seconds)  URINALYSIS, ROUTINE W REFLEX MICROSCOPIC      Component Value Range   Color, Urine YELLOW  YELLOW    Appearance CLEAR  CLEAR    Specific Gravity, Urine <1.005 (*) 1.005 - 1.030    pH 5.5  5.0 - 8.0    Glucose, UA NEGATIVE  NEGATIVE (mg/dL)   Hgb urine dipstick NEGATIVE  NEGATIVE    Bilirubin Urine NEGATIVE  NEGATIVE    Ketones, ur NEGATIVE  NEGATIVE (mg/dL)   Protein, ur NEGATIVE  NEGATIVE (mg/dL)  Urobilinogen, UA 0.2  0.0 - 1.0 (mg/dL)   Nitrite NEGATIVE  NEGATIVE    Leukocytes, UA NEGATIVE  NEGATIVE    Results for orders placed during the hospital encounter of 11/21/10  CBC      Component Value Range   WBC 9.3  4.0 - 10.5 (K/uL)   RBC 4.74  4.22 - 5.81 (MIL/uL)   Hemoglobin 14.5  13.0 - 17.0 (g/dL)   HCT 16.1  09.6 - 04.5 (%)   MCV 91.6  78.0 - 100.0 (fL)   MCH 30.6  26.0 - 34.0 (pg)   MCHC 33.4  30.0 - 36.0 (g/dL)   RDW 40.9  81.1 - 91.4 (%)   Platelets 254  150 - 400 (K/uL)  DIFFERENTIAL      Component Value Range   Neutrophils Relative 57  43 - 77 (%)   Neutro Abs 5.3  1.7 - 7.7 (K/uL)   Lymphocytes Relative 33  12 - 46 (%)   Lymphs Abs 3.1  0.7 - 4.0 (K/uL)   Monocytes Relative 5  3 - 12 (%)   Monocytes Absolute 0.5  0.1 - 1.0 (K/uL)   Eosinophils Relative 4  0 - 5 (%)   Eosinophils Absolute 0.4  0.0 - 0.7 (K/uL)   Basophils Relative 0  0 - 1 (%)   Basophils Absolute 0.0  0.0 - 0.1 (K/uL)  COMPREHENSIVE METABOLIC PANEL      Component Value Range   Sodium 142  135 - 145 (mEq/L)   Potassium 4.2  3.5 - 5.1 (mEq/L)   Chloride 105  96 - 112 (mEq/L)   CO2 25  19 - 32 (mEq/L)   Glucose, Bld 92  70 - 99 (mg/dL)   BUN 7  6 - 23 (mg/dL)   Creatinine, Ser 7.82  0.50 - 1.35 (mg/dL)   Calcium 8.9  8.4 - 95.6 (mg/dL)   Total Protein 6.5  6.0 - 8.3 (g/dL)   Albumin 3.5  3.5 - 5.2 (g/dL)   AST 14  0 - 37  (U/L)   ALT 9  0 - 53 (U/L)   Alkaline Phosphatase 104  39 - 117 (U/L)   Total Bilirubin 0.2 (*) 0.3 - 1.2 (mg/dL)   GFR calc non Af Amer >60  >60 (mL/min)   GFR calc Af Amer >60  >60 (mL/min)  PROTIME-INR      Component Value Range   Prothrombin Time 13.4  11.6 - 15.2 (seconds)   INR 1.00  0.00 - 1.49   APTT      Component Value Range   aPTT 30  24 - 37 (seconds)  URINALYSIS, ROUTINE W REFLEX MICROSCOPIC      Component Value Range   Color, Urine YELLOW  YELLOW    Appearance CLEAR  CLEAR    Specific Gravity, Urine <1.005 (*) 1.005 - 1.030    pH 5.5  5.0 - 8.0    Glucose, UA NEGATIVE  NEGATIVE (mg/dL)   Hgb urine dipstick NEGATIVE  NEGATIVE    Bilirubin Urine NEGATIVE  NEGATIVE    Ketones, ur NEGATIVE  NEGATIVE (mg/dL)   Protein, ur NEGATIVE  NEGATIVE (mg/dL)   Urobilinogen, UA 0.2  0.0 - 1.0 (mg/dL)   Nitrite NEGATIVE  NEGATIVE    Leukocytes, UA NEGATIVE  NEGATIVE    Dg Chest 2 View  11/21/2010  *RADIOLOGY REPORT*  Clinical Data: Muscle aches, fatigue, rash  CHEST - 2 VIEW  Comparison: None.  Findings: Lungs are clear. No pleural effusion or pneumothorax.  Cardiomediastinal silhouette is within normal limits.  Visualized osseous structures are within normal limits.  IMPRESSION: Normal chest radiographs.  Original Report Authenticated By: Charline Bills, M.D.    Shelda Jakes, MD 11/21/10 1610  Shelda Jakes, MD 11/22/10 [phone]

## 2010-11-22 MED ORDER — TETANUS-DIPHTH-ACELL PERTUSSIS 5-2.5-18.5 LF-MCG/0.5 IM SUSP
0.5000 mL | Freq: Once | INTRAMUSCULAR | Status: AC
  Administered 2010-11-22: 0.5 mL via INTRAMUSCULAR
  Filled 2010-11-22 (×2): qty 0.5

## 2010-11-22 MED ORDER — DOXYCYCLINE HYCLATE 100 MG PO CAPS: 100.0000 mg | ORAL_CAPSULE | Freq: Two times a day (BID) | ORAL | Status: AC

## 2010-11-22 MED ORDER — DOXYCYCLINE HYCLATE 100 MG PO TABS
100.0000 mg | ORAL_TABLET | Freq: Once | ORAL | Status: AC
  Administered 2010-11-22: 100 mg via ORAL
  Filled 2010-11-22: qty 1

## 2010-11-22 NOTE — ED Provider Notes (Signed)
Medical screening examination/treatment/procedure(s) were conducted as a shared visit with non-physician practitioner(s) and myself.  I personally evaluated the patient during the encounter  SEE MY NOTE. SEEN BY ME AND EXAMINED AND WILL RX WITH DOXY FOR UNKNOWN RASH AND ? MRSA.   Shelda Jakes, MD 11/22/10 [phone]

## 2010-11-22 NOTE — ED Notes (Signed)
Pt self ambulated out with a steady gait stating no needs 

## 2010-11-23 ENCOUNTER — Emergency Department (HOSPITAL_COMMUNITY)
Admission: EM | Admit: 2010-11-23 | Discharge: 2010-11-23 | Disposition: A | Discharge status: Home / Self Care | Payer: Medicare Other | Attending: Emergency Medicine | Admitting: Emergency Medicine

## 2010-11-23 ENCOUNTER — Encounter (HOSPITAL_COMMUNITY): Payer: Self-pay

## 2010-11-23 DIAGNOSIS — J449 Chronic obstructive pulmonary disease, unspecified: Secondary | ICD-10-CM | POA: Insufficient documentation

## 2010-11-23 DIAGNOSIS — I10 Essential (primary) hypertension: Secondary | ICD-10-CM | POA: Insufficient documentation

## 2010-11-23 DIAGNOSIS — J4489 Other specified chronic obstructive pulmonary disease: Secondary | ICD-10-CM | POA: Insufficient documentation

## 2010-11-23 DIAGNOSIS — R21 Rash and other nonspecific skin eruption: Secondary | ICD-10-CM | POA: Insufficient documentation

## 2010-11-23 LAB — COMPREHENSIVE METABOLIC PANEL
ALT: 12 U/L (ref 0–53)
AST: 19 U/L (ref 0–37)
Albumin: 3.2 g/dL — ABNORMAL LOW (ref 3.5–5.2)
Alkaline Phosphatase: 95 U/L (ref 39–117)
BUN: 7 mg/dL (ref 6–23)
CO2: 22 mEq/L (ref 19–32)
Calcium: 8.9 mg/dL (ref 8.4–10.5)
Chloride: 105 mEq/L (ref 96–112)
Creatinine, Ser: 0.82 mg/dL (ref 0.50–1.35)
GFR calc Af Amer: >60 mL/min (ref >60)
GFR calc non Af Amer: >60 mL/min (ref >60)
Glucose, Bld: 89 mg/dL (ref 70–99)
Potassium: 3.7 mEq/L (ref 3.5–5.1)
Sodium: 140 mEq/L (ref 135–145)
Total Bilirubin: 0.2 mg/dL — ABNORMAL LOW (ref 0.3–1.2)
Total Protein: 6.6 g/dL (ref 6.0–8.3)

## 2010-11-23 LAB — CBC
HCT: 41.3 % (ref 39.0–52.0)
Hemoglobin: 14.1 g/dL (ref 13.0–17.0)
MCH: 31 pg (ref 26.0–34.0)
MCHC: 34.1 g/dL (ref 30.0–36.0)
MCV: 90.8 fL (ref 78.0–100.0)
Platelets: 263 10*3/uL (ref 150–400)
RBC: 4.55 MIL/uL (ref 4.22–5.81)
RDW: 13.8 % (ref 11.5–15.5)
WBC: 11.8 10*3/uL — ABNORMAL HIGH (ref 4.0–10.5)

## 2010-11-23 LAB — DIFFERENTIAL
Basophils Absolute: 0 10*3/uL (ref 0.0–0.1)
Basophils Relative: 0 % (ref 0–1)
Eosinophils Absolute: 0.3 10*3/uL (ref 0.0–0.7)
Eosinophils Relative: 2 % (ref 0–5)
Lymphocytes Relative: 22 % (ref 12–46)
Lymphs Abs: 2.6 10*3/uL (ref 0.7–4.0)
Monocytes Absolute: 0.6 10*3/uL (ref 0.1–1.0)
Monocytes Relative: 5 % (ref 3–12)
Neutro Abs: 8.4 10*3/uL — ABNORMAL HIGH (ref 1.7–7.7)
Neutrophils Relative %: 71 % (ref 43–77)

## 2010-11-23 LAB — SEDIMENTATION RATE: Sed Rate: 7 mm/hr (ref 0–16)

## 2010-11-23 MED ORDER — ACYCLOVIR 400 MG PO TABS: 400.0000 mg | ORAL_TABLET | Freq: Four times a day (QID) | ORAL | Status: DC

## 2010-11-23 MED ORDER — ACYCLOVIR 400 MG PO TABS: 400.0000 mg | ORAL_TABLET | Freq: Four times a day (QID) | ORAL | Status: AC

## 2010-11-23 NOTE — ED Notes (Signed)
Pt reports rash all over body that starts as blisters and then they bust.  Pt has them all over his body.  Pt was seen here on 8/9 and is here for a re-check.

## 2010-11-23 NOTE — ED Provider Notes (Signed)
History     CSN: [id number] Arrival date & time: 11/23/2010  6:58 PM  Chief Complaint  Patient presents with  . Rash   HPI Comments: Patient presents with complaint of rash. This is a rash that covers both his upper and lower extremities and his trunk. It started several days ago and always begins as a vesicular rash which then crusts over after the lesions pop. He states that these have a burning quality to them. He has been started on doxycycline at her prior emergency visit which has not improved his symptoms at all. He denies respiratory symptoms nausea or vomiting or fevers. He has never had the chickenpox and has never been immunized against either. His spouse has had chickenpox and shingles outbreaks. Symptoms are moderate at this time. He denies lesions in his mouth he denies new medications.  Patient is a 40 y.o. male presenting with rash. The history is provided by the patient, the spouse and medical records.  Rash     History reviewed. No pertinent past medical history.  Past Surgical History  Procedure Date  . Cholecystectomy   . Knee surgery   . Thumb surgery     Family History  Problem Relation Age of Onset  . Hypertension Mother   . Heart failure Mother   . COPD Mother     History  Substance Use Topics  . Smoking status: Current Everyday Smoker -- 1.0 packs/day  . Smokeless tobacco: Never Used  . Alcohol Use: No      Review of Systems  Constitutional: Negative for fever and chills.  HENT: Negative for sore throat and neck pain.   Eyes: Negative for visual disturbance.  Respiratory: Negative for cough and shortness of breath.   Cardiovascular: Negative for chest pain.  Gastrointestinal: Negative for nausea, vomiting, abdominal pain and diarrhea.  Genitourinary: Negative for dysuria and frequency.  Musculoskeletal: Negative for back pain.  Skin: Positive for rash.  Neurological: Negative for weakness, numbness and headaches.  Hematological: Negative for  adenopathy.  Psychiatric/Behavioral: Negative for behavioral problems.    Physical Exam  BP 107/62  Pulse 51  Temp(Src) 98.4 F (36.9 C) (Oral)  Resp 20  Ht 5\' 6"  (1.676 m)  Wt 150 lb (68.04 kg)  BMI 24.21 kg/m2  SpO2 96%  Physical Exam  Nursing note and vitals reviewed. Constitutional: He appears well-developed and well-nourished. No distress.  HENT:  Head: Normocephalic and atraumatic.  Mouth/Throat: Oropharynx is clear and moist. No oropharyngeal exudate.  Eyes: Conjunctivae and EOM are normal. Pupils are equal, round, and reactive to light. Right eye exhibits no discharge. Left eye exhibits no discharge. No scleral icterus.  Neck: Normal range of motion. Neck supple. No JVD present. No thyromegaly present.  Cardiovascular: Normal rate, regular rhythm, normal heart sounds and intact distal pulses.  Exam reveals no gallop and no friction rub.   No murmur heard. Pulmonary/Chest: Effort normal and breath sounds normal. No respiratory distress. He has no wheezes. He has no rales.  Abdominal: Soft. Bowel sounds are normal. He exhibits no distension and no mass. There is no tenderness.  Musculoskeletal: Normal range of motion. He exhibits no edema and no tenderness.  Lymphadenopathy:    He has no cervical adenopathy.  Neurological: He is alert. Coordination normal.  Skin: Skin is warm and dry. Rash noted. He is not diaphoretic.       Diffuse rash over the arms and legs in patches. Small areas of clear vesicles also areas of ruptured vesicles on  erythematous base but most areas are consistent with a scabbed over vesicles. These are mildly tender to palpation. There are no pustules. There is no petechiae  or purpura  Psychiatric: He has a normal mood and affect. His behavior is normal.    ED Course  Procedures  MDM Patient with rash and possible exposure to shingles from wife who has had that in the past. She reports no recent area of the doxycycline is not helping the rash could  be consistent with a personal outbreaks. The patient denies ever having this in the past and has never been fractionated. He does have myalgias with this. Will evaluate with lab work to make sure no other findings arise after which anticipate starting antivirals.   Blood counts revealed a slight leukocytosis with a count of 11,800, lymphocytic predominance. Normal comprehensive metabolic panel. Vital signs normal.   Vida Roller, MD 11/23/10 2047

## 2011-06-11 DIAGNOSIS — M545 Low back pain, unspecified: Secondary | ICD-10-CM | POA: Diagnosis not present

## 2011-06-11 DIAGNOSIS — M5137 Other intervertebral disc degeneration, lumbosacral region: Secondary | ICD-10-CM | POA: Diagnosis not present

## 2011-06-11 DIAGNOSIS — M1710 Unilateral primary osteoarthritis, unspecified knee: Secondary | ICD-10-CM | POA: Diagnosis not present

## 2011-06-11 DIAGNOSIS — M47814 Spondylosis without myelopathy or radiculopathy, thoracic region: Secondary | ICD-10-CM | POA: Diagnosis not present

## 2011-06-24 DIAGNOSIS — G8929 Other chronic pain: Secondary | ICD-10-CM | POA: Diagnosis not present

## 2011-06-25 DIAGNOSIS — F17200 Nicotine dependence, unspecified, uncomplicated: Secondary | ICD-10-CM | POA: Diagnosis not present

## 2011-06-25 DIAGNOSIS — Z79899 Other long term (current) drug therapy: Secondary | ICD-10-CM | POA: Diagnosis not present

## 2011-06-25 DIAGNOSIS — S61409A Unspecified open wound of unspecified hand, initial encounter: Secondary | ICD-10-CM | POA: Diagnosis not present

## 2011-06-30 DIAGNOSIS — L0390 Cellulitis, unspecified: Secondary | ICD-10-CM | POA: Diagnosis not present

## 2011-06-30 DIAGNOSIS — F411 Generalized anxiety disorder: Secondary | ICD-10-CM | POA: Diagnosis not present

## 2011-06-30 DIAGNOSIS — L0291 Cutaneous abscess, unspecified: Secondary | ICD-10-CM | POA: Diagnosis not present

## 2011-06-30 DIAGNOSIS — F3289 Other specified depressive episodes: Secondary | ICD-10-CM | POA: Diagnosis not present

## 2011-06-30 DIAGNOSIS — F329 Major depressive disorder, single episode, unspecified: Secondary | ICD-10-CM | POA: Diagnosis not present

## 2011-06-30 DIAGNOSIS — T148XXA Other injury of unspecified body region, initial encounter: Secondary | ICD-10-CM | POA: Diagnosis not present

## 2011-07-01 DIAGNOSIS — S61409A Unspecified open wound of unspecified hand, initial encounter: Secondary | ICD-10-CM | POA: Diagnosis not present

## 2011-07-02 DIAGNOSIS — S61209A Unspecified open wound of unspecified finger without damage to nail, initial encounter: Secondary | ICD-10-CM | POA: Diagnosis not present

## 2011-07-02 DIAGNOSIS — S61409A Unspecified open wound of unspecified hand, initial encounter: Secondary | ICD-10-CM | POA: Diagnosis not present

## 2011-07-02 DIAGNOSIS — S61509A Unspecified open wound of unspecified wrist, initial encounter: Secondary | ICD-10-CM | POA: Diagnosis not present

## 2011-07-02 DIAGNOSIS — X58XXXA Exposure to other specified factors, initial encounter: Secondary | ICD-10-CM | POA: Diagnosis not present

## 2011-07-02 DIAGNOSIS — Y929 Unspecified place or not applicable: Secondary | ICD-10-CM | POA: Diagnosis not present

## 2011-07-30 DIAGNOSIS — D7289 Other specified disorders of white blood cells: Secondary | ICD-10-CM | POA: Diagnosis not present

## 2011-07-30 DIAGNOSIS — R05 Cough: Secondary | ICD-10-CM | POA: Diagnosis not present

## 2011-07-30 DIAGNOSIS — R5381 Other malaise: Secondary | ICD-10-CM | POA: Diagnosis not present

## 2011-07-30 DIAGNOSIS — R059 Cough, unspecified: Secondary | ICD-10-CM | POA: Diagnosis not present

## 2011-07-30 DIAGNOSIS — R634 Abnormal weight loss: Secondary | ICD-10-CM | POA: Diagnosis not present

## 2011-08-08 ENCOUNTER — Other Ambulatory Visit (HOSPITAL_COMMUNITY): Payer: Self-pay | Admitting: Pediatrics

## 2011-08-08 ENCOUNTER — Ambulatory Visit (HOSPITAL_COMMUNITY)
Admission: RE | Admit: 2011-08-08 | Discharge: 2011-08-08 | Disposition: A | Discharge status: Home / Self Care | Payer: Medicare Other | Source: Ambulatory Visit | Attending: Pediatrics | Admitting: Pediatrics

## 2011-08-08 DIAGNOSIS — R05 Cough: Secondary | ICD-10-CM | POA: Diagnosis not present

## 2011-08-08 DIAGNOSIS — R634 Abnormal weight loss: Secondary | ICD-10-CM

## 2011-08-08 DIAGNOSIS — F17200 Nicotine dependence, unspecified, uncomplicated: Secondary | ICD-10-CM

## 2011-08-08 DIAGNOSIS — R059 Cough, unspecified: Secondary | ICD-10-CM

## 2011-12-02 ENCOUNTER — Emergency Department (HOSPITAL_COMMUNITY): Payer: Medicare Other

## 2011-12-02 ENCOUNTER — Emergency Department (HOSPITAL_COMMUNITY)
Admission: EM | Admit: 2011-12-02 | Discharge: 2011-12-02 | Disposition: A | Discharge status: Home / Self Care | Payer: Medicare Other | Attending: Emergency Medicine | Admitting: Emergency Medicine

## 2011-12-02 ENCOUNTER — Encounter (HOSPITAL_COMMUNITY): Payer: Self-pay

## 2011-12-02 DIAGNOSIS — M79609 Pain in unspecified limb: Secondary | ICD-10-CM | POA: Insufficient documentation

## 2011-12-02 DIAGNOSIS — Z043 Encounter for examination and observation following other accident: Secondary | ICD-10-CM | POA: Diagnosis not present

## 2011-12-02 DIAGNOSIS — M79645 Pain in left finger(s): Secondary | ICD-10-CM

## 2011-12-02 HISTORY — DX: Post-traumatic stress disorder, unspecified: F43.10

## 2011-12-02 MED ORDER — IBUPROFEN 800 MG PO TABS
800.0000 mg | ORAL_TABLET | Freq: Once | ORAL | Status: AC
  Administered 2011-12-02: 800 mg via ORAL
  Filled 2011-12-02: qty 1

## 2011-12-02 MED ORDER — HYDROCODONE-ACETAMINOPHEN 5-325 MG PO TABS
1.0000 | ORAL_TABLET | Freq: Once | ORAL | Status: AC
  Administered 2011-12-02: 1 via ORAL
  Filled 2011-12-02: qty 1

## 2011-12-02 MED ORDER — HYDROCODONE-ACETAMINOPHEN 5-325 MG PO TABS: 1.0000 | ORAL_TABLET | ORAL | Status: AC | PRN

## 2011-12-02 NOTE — ED Provider Notes (Signed)
History     CSN: [id number]  Arrival date & time 12/02/11  0007   First MD Initiated Contact with Patient 12/02/11 [phone]      Chief Complaint  Patient presents with  . thumb injury     (Consider location/radiation/quality/duration/timing/severity/associated sxs/prior treatment) HPI Joshua Hale is a 41 y.o. male with a h/o left thumb fusion due to a remote injury who presents to the Emergency Department complaining of left thumb pain resulting from playing with his daughter and sustaining a twisting injury. Pain is worth with apposition. HE has taken no medicines.   PCP Dr. Milford Cage  Past Medical History  Diagnosis Date  . Post traumatic stress disorder (PTSD)     Past Surgical History  Procedure Date  . Cholecystectomy   . Knee surgery   . Thumb surgery     Family History  Problem Relation Age of Onset  . Hypertension Mother   . Heart failure Mother   . COPD Mother     History  Substance Use Topics  . Smoking status: Current Everyday Smoker -- 1.0 packs/day  . Smokeless tobacco: Never Used  . Alcohol Use: No      Review of Systems  Constitutional: Negative for fever.       10 Systems reviewed and are negative for acute change except as noted in the HPI.  HENT: Negative for congestion.   Eyes: Negative for discharge and redness.  Respiratory: Negative for cough and shortness of breath.   Cardiovascular: Negative for chest pain.  Gastrointestinal: Negative for vomiting and abdominal pain.  Musculoskeletal: Negative for back pain.       Left thumb pain  Skin: Negative for rash.  Neurological: Negative for syncope, numbness and headaches.  Psychiatric/Behavioral:       No behavior change.    Allergies  Fentanyl; Onion; and Ultram  Home Medications   Current Outpatient Rx  Name Route Sig Dispense Refill  . CYCLOBENZAPRINE HCL 5 MG PO TABS Oral Take 5 mg by mouth 3 (three) times daily as needed.    Marland Kitchen DIAZEPAM 10 MG PO TABS Oral Take 10 mg by mouth 4  (four) times daily. And take 2 tablets at bedtime     . GABAPENTIN 800 MG PO TABS Oral Take 800 mg by mouth 2 (two) times daily.      Marland Kitchen MIRTAZAPINE 30 MG PO TABS Oral Take 30 mg by mouth at bedtime.      . VENLAFAXINE HCL 100 MG PO TABS Oral Take 112.5 mg by mouth daily.     Marland Kitchen HYDROCODONE-ACETAMINOPHEN 5-325 MG PO TABS Oral Take 1 tablet by mouth every 4 (four) hours as needed for pain. 15 tablet 0  . HYDROCORTISONE 1 % EX CREA Topical Apply 1 application topically daily as needed. For rash on arms     . MORPHINE SULFATE ER 15 MG PO TB12 Oral Take 15 mg by mouth 2 (two) times daily.      Marland Kitchen OMEPRAZOLE 20 MG PO CPDR Oral Take 20 mg by mouth 2 (two) times daily.      Marland Kitchen ONDANSETRON HCL 4 MG PO TABS Oral Take 4 mg by mouth every 8 (eight) hours as needed. For nausea       There were no vitals taken for this visit.  Physical Exam  Nursing note and vitals reviewed. Constitutional:       Awake, alert, nontoxic appearance.  HENT:  Head: Atraumatic.  Eyes: Right eye exhibits no discharge. Left eye exhibits  no discharge.  Neck: Neck supple.  Cardiovascular: Normal heart sounds.   Pulmonary/Chest: Effort normal and breath sounds normal. He exhibits no tenderness.  Abdominal: Soft. There is no tenderness. There is no rebound.  Musculoskeletal: He exhibits no tenderness.       Baseline ROM, no obvious new focal weakness. Left thumb with no swelling, erythema. ROM is normal for thumb given fusion. Sensation to light touch intact. Cap refill brisk.  Neurological:       Mental status and motor strength appears baseline for patient and situation.  Skin: No rash noted.  Psychiatric: He has a normal mood and affect.    ED Course  Procedures (including critical care time)  Labs Reviewed - No data to display Dg Finger Thumb Left  12/02/2011  *RADIOLOGY REPORT*  Clinical Data: Left thumb pain after injury.  LEFT THUMB 2+V  Comparison: None.  Findings: There is fusion of the interphalangeal joint of the  left first finger which may be postoperative.  Apparent surgical clips in the soft tissues of the left first finger.  No evidence of acute fracture or subluxation.  No focal bone lesion or bone destruction. Bone cortex and trabecular architecture appear intact.  IMPRESSION: Fusion of the interphalangeal joint of the left first finger, likely postoperative.  No acute changes suggested.   Original Report Authenticated By: Marlon Pel, M.D.      1. Pain of left thumb       MDM  Patient with injury to left thumb. Xrays without acute findings. Given antiinflammatory and analgesic. Placed in a splint. Referral to orthopedist. Pt stable in ED with no significant deterioration in condition.The patient appears reasonably screened and/or stabilized for discharge and I doubt any other medical condition or other [facility] requiring further screening, evaluation, or treatment in the ED at this time prior to discharge.  MDM Reviewed: nursing note and vitals Interpretation: x-ray           Nicoletta Dress. Colon Branch, MD 12/02/11 2127

## 2011-12-02 NOTE — ED Notes (Signed)
Left thumb injury tonight while playing with his daughter, has previous history of thumb amputation many years ago.

## 2011-12-22 DIAGNOSIS — G4700 Insomnia, unspecified: Secondary | ICD-10-CM | POA: Diagnosis not present

## 2011-12-22 DIAGNOSIS — F329 Major depressive disorder, single episode, unspecified: Secondary | ICD-10-CM | POA: Diagnosis not present

## 2011-12-22 DIAGNOSIS — G8929 Other chronic pain: Secondary | ICD-10-CM | POA: Diagnosis not present

## 2011-12-22 DIAGNOSIS — F3289 Other specified depressive episodes: Secondary | ICD-10-CM | POA: Diagnosis not present

## 2012-06-07 DIAGNOSIS — F411 Generalized anxiety disorder: Secondary | ICD-10-CM | POA: Diagnosis not present

## 2012-06-07 DIAGNOSIS — M76899 Other specified enthesopathies of unspecified lower limb, excluding foot: Secondary | ICD-10-CM | POA: Diagnosis not present

## 2012-06-07 DIAGNOSIS — F329 Major depressive disorder, single episode, unspecified: Secondary | ICD-10-CM | POA: Diagnosis not present

## 2012-06-07 DIAGNOSIS — F3289 Other specified depressive episodes: Secondary | ICD-10-CM | POA: Diagnosis not present

## 2012-08-06 DIAGNOSIS — M549 Dorsalgia, unspecified: Secondary | ICD-10-CM | POA: Diagnosis not present

## 2012-08-20 DIAGNOSIS — S61209A Unspecified open wound of unspecified finger without damage to nail, initial encounter: Secondary | ICD-10-CM | POA: Diagnosis not present

## 2012-08-20 DIAGNOSIS — Z79899 Other long term (current) drug therapy: Secondary | ICD-10-CM | POA: Diagnosis not present

## 2012-08-20 DIAGNOSIS — F17200 Nicotine dependence, unspecified, uncomplicated: Secondary | ICD-10-CM | POA: Diagnosis not present

## 2012-08-20 DIAGNOSIS — IMO0002 Reserved for concepts with insufficient information to code with codable children: Secondary | ICD-10-CM | POA: Diagnosis not present

## 2012-09-09 IMAGING — CR DG CHEST 2V
2 series · 2 of 2 positions shown · non-contrast
Comparison: None.

CLINICAL DATA: Muscle aches, fatigue, rash

CHEST - 2 VIEW

[view not recorded (1 of 2)]
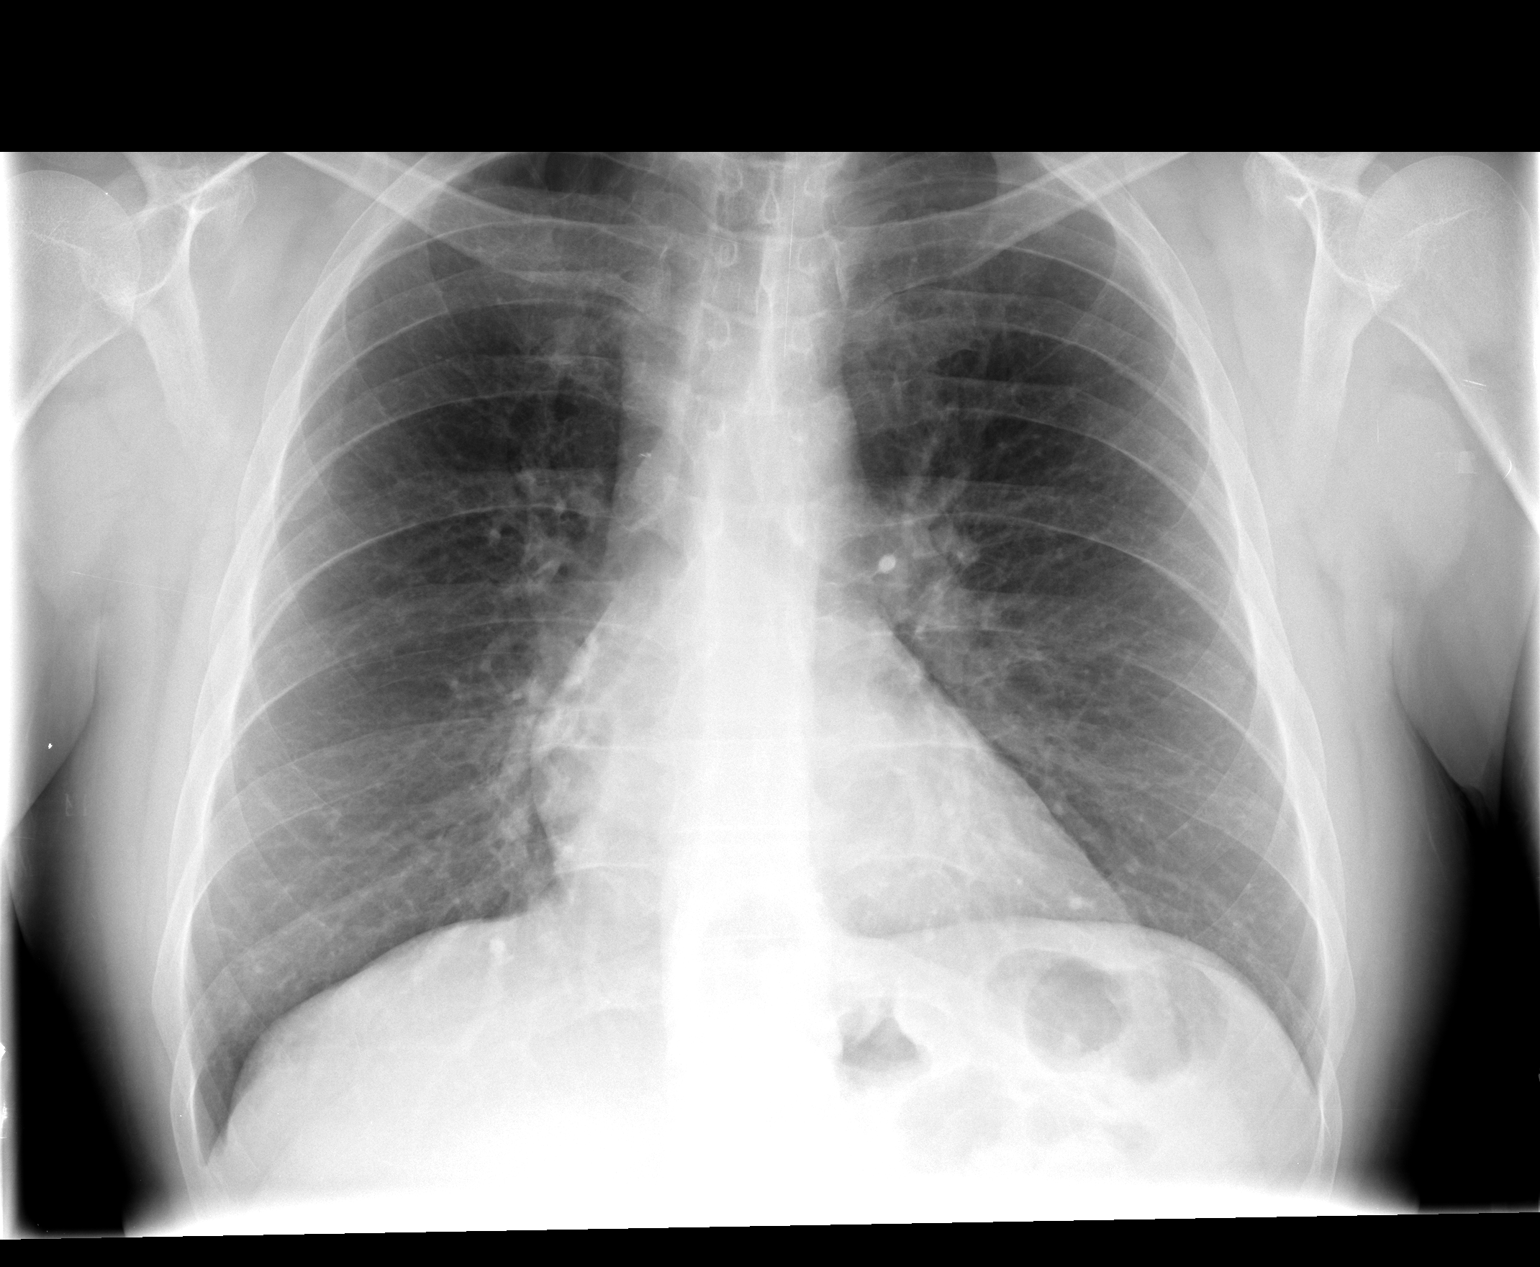

[view not recorded (2 of 2)]
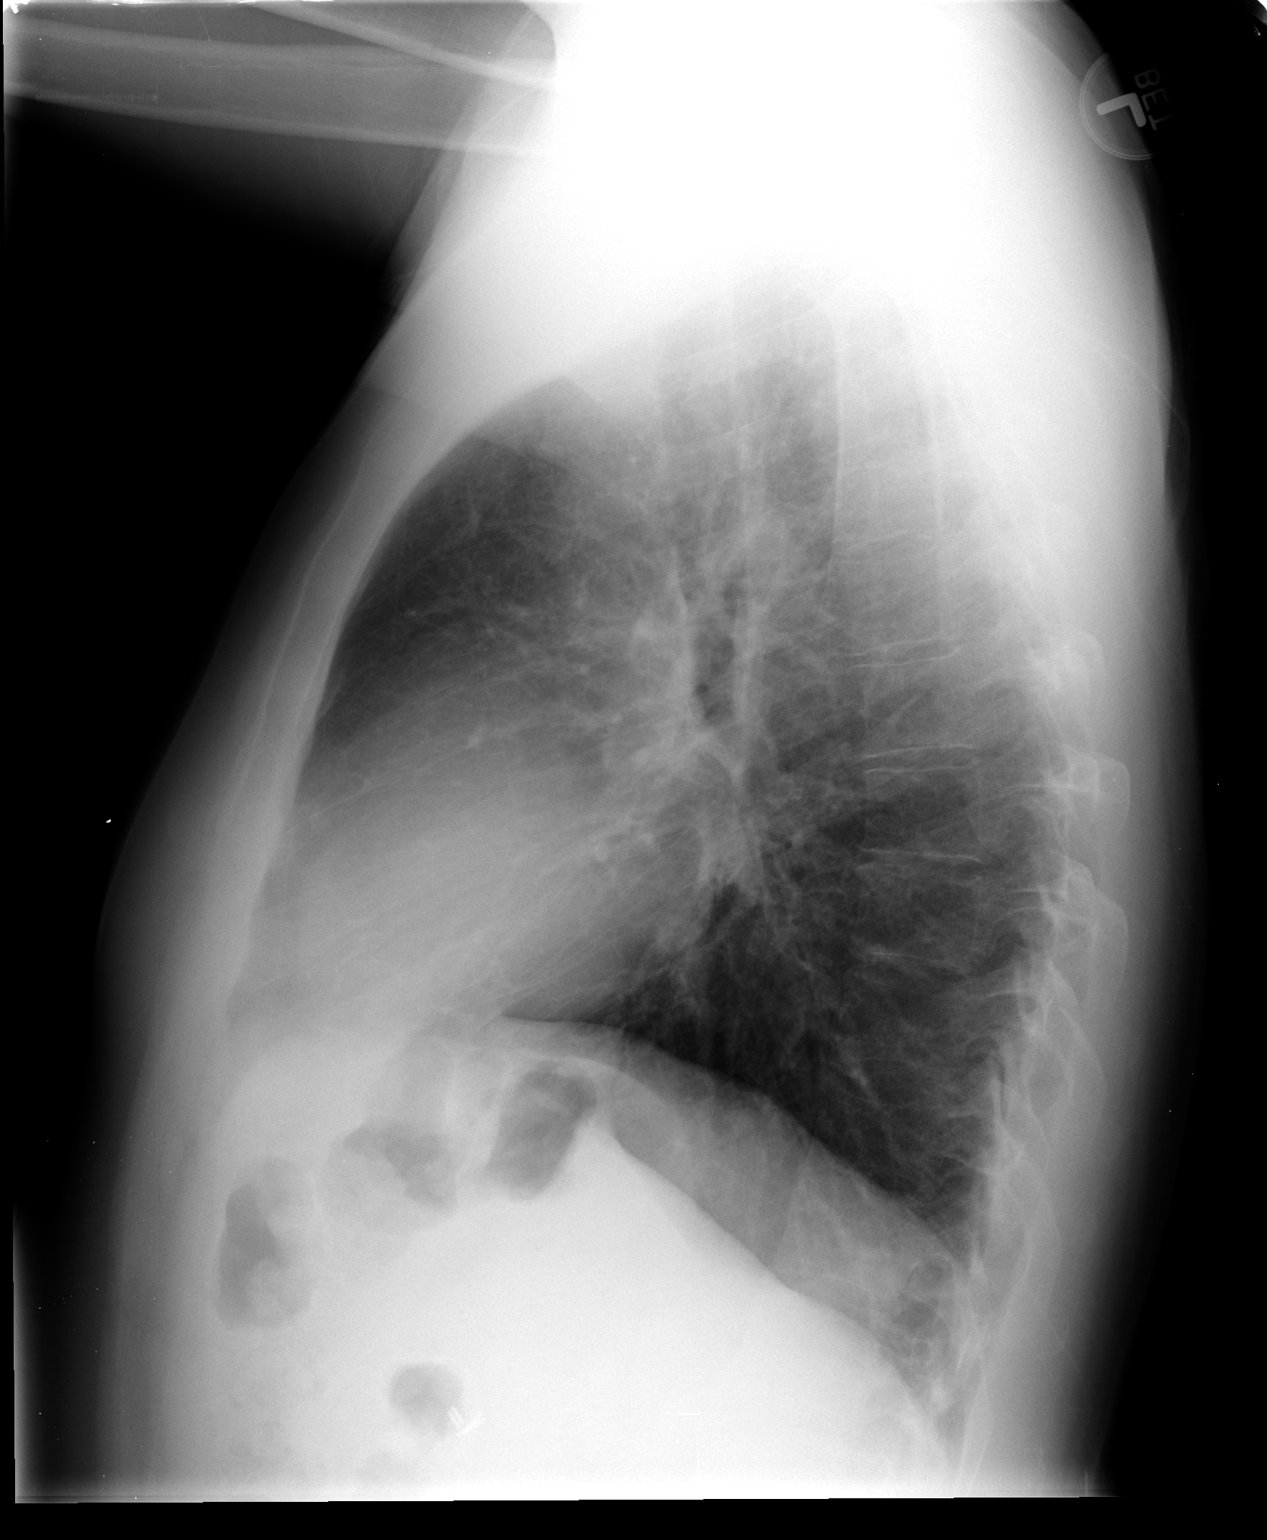

[2 of 2 positions shown; findings below may reference images not displayed]

FINDINGS: Lungs are clear. No pleural effusion or pneumothorax.

Cardiomediastinal silhouette is within normal limits.

Visualized osseous structures are within normal limits.
IMPRESSION: Normal chest radiographs.

## 2012-10-07 DIAGNOSIS — M76899 Other specified enthesopathies of unspecified lower limb, excluding foot: Secondary | ICD-10-CM | POA: Diagnosis not present

## 2012-12-10 DIAGNOSIS — M549 Dorsalgia, unspecified: Secondary | ICD-10-CM | POA: Diagnosis not present

## 2013-01-28 DIAGNOSIS — J189 Pneumonia, unspecified organism: Secondary | ICD-10-CM | POA: Diagnosis not present

## 2013-01-28 DIAGNOSIS — J852 Abscess of lung without pneumonia: Secondary | ICD-10-CM | POA: Diagnosis not present

## 2013-01-28 DIAGNOSIS — J150 Pneumonia due to Klebsiella pneumoniae: Secondary | ICD-10-CM | POA: Diagnosis not present

## 2013-01-28 DIAGNOSIS — E440 Moderate protein-calorie malnutrition: Secondary | ICD-10-CM | POA: Diagnosis not present

## 2013-01-28 DIAGNOSIS — E871 Hypo-osmolality and hyponatremia: Secondary | ICD-10-CM | POA: Diagnosis not present

## 2013-01-28 DIAGNOSIS — E876 Hypokalemia: Secondary | ICD-10-CM | POA: Diagnosis not present

## 2013-01-29 DIAGNOSIS — E440 Moderate protein-calorie malnutrition: Secondary | ICD-10-CM | POA: Diagnosis not present

## 2013-01-29 DIAGNOSIS — J150 Pneumonia due to Klebsiella pneumoniae: Secondary | ICD-10-CM | POA: Diagnosis not present

## 2013-01-29 DIAGNOSIS — J852 Abscess of lung without pneumonia: Secondary | ICD-10-CM | POA: Diagnosis not present

## 2013-01-29 DIAGNOSIS — E876 Hypokalemia: Secondary | ICD-10-CM | POA: Diagnosis not present

## 2013-01-29 DIAGNOSIS — E871 Hypo-osmolality and hyponatremia: Secondary | ICD-10-CM | POA: Diagnosis not present

## 2013-02-01 DIAGNOSIS — J189 Pneumonia, unspecified organism: Secondary | ICD-10-CM | POA: Diagnosis not present

## 2013-02-03 DIAGNOSIS — E876 Hypokalemia: Secondary | ICD-10-CM | POA: Diagnosis not present

## 2013-02-03 DIAGNOSIS — J852 Abscess of lung without pneumonia: Secondary | ICD-10-CM | POA: Diagnosis not present

## 2013-02-03 DIAGNOSIS — E440 Moderate protein-calorie malnutrition: Secondary | ICD-10-CM | POA: Diagnosis not present

## 2013-02-03 DIAGNOSIS — J150 Pneumonia due to Klebsiella pneumoniae: Secondary | ICD-10-CM | POA: Diagnosis not present

## 2013-02-03 DIAGNOSIS — E871 Hypo-osmolality and hyponatremia: Secondary | ICD-10-CM | POA: Diagnosis not present

## 2013-02-08 DIAGNOSIS — B961 Klebsiella pneumoniae [K. pneumoniae] as the cause of diseases classified elsewhere: Secondary | ICD-10-CM | POA: Diagnosis not present

## 2013-04-08 DIAGNOSIS — M549 Dorsalgia, unspecified: Secondary | ICD-10-CM | POA: Diagnosis not present

## 2013-04-08 DIAGNOSIS — Z0000 Encounter for general adult medical examination without abnormal findings: Secondary | ICD-10-CM | POA: Diagnosis not present

## 2013-05-12 DIAGNOSIS — M545 Low back pain, unspecified: Secondary | ICD-10-CM | POA: Diagnosis not present

## 2013-05-12 DIAGNOSIS — M546 Pain in thoracic spine: Secondary | ICD-10-CM | POA: Diagnosis not present

## 2013-05-12 DIAGNOSIS — Z79899 Other long term (current) drug therapy: Secondary | ICD-10-CM | POA: Diagnosis not present

## 2013-05-12 DIAGNOSIS — G894 Chronic pain syndrome: Secondary | ICD-10-CM | POA: Diagnosis not present

## 2013-05-12 DIAGNOSIS — M542 Cervicalgia: Secondary | ICD-10-CM | POA: Diagnosis not present

## 2013-05-26 DIAGNOSIS — Z79899 Other long term (current) drug therapy: Secondary | ICD-10-CM | POA: Diagnosis not present

## 2013-05-26 DIAGNOSIS — G894 Chronic pain syndrome: Secondary | ICD-10-CM | POA: Diagnosis not present

## 2013-05-26 DIAGNOSIS — M25569 Pain in unspecified knee: Secondary | ICD-10-CM | POA: Diagnosis not present

## 2013-05-26 DIAGNOSIS — M546 Pain in thoracic spine: Secondary | ICD-10-CM | POA: Diagnosis not present

## 2013-05-26 DIAGNOSIS — M545 Low back pain, unspecified: Secondary | ICD-10-CM | POA: Diagnosis not present

## 2013-06-10 DIAGNOSIS — M76899 Other specified enthesopathies of unspecified lower limb, excluding foot: Secondary | ICD-10-CM | POA: Diagnosis not present

## 2013-06-21 DIAGNOSIS — Z79899 Other long term (current) drug therapy: Secondary | ICD-10-CM | POA: Diagnosis not present

## 2013-06-21 DIAGNOSIS — M542 Cervicalgia: Secondary | ICD-10-CM | POA: Diagnosis not present

## 2013-06-21 DIAGNOSIS — M5380 Other specified dorsopathies, site unspecified: Secondary | ICD-10-CM | POA: Diagnosis not present

## 2013-06-21 DIAGNOSIS — M546 Pain in thoracic spine: Secondary | ICD-10-CM | POA: Diagnosis not present

## 2013-06-21 DIAGNOSIS — G894 Chronic pain syndrome: Secondary | ICD-10-CM | POA: Diagnosis not present

## 2013-07-05 DIAGNOSIS — Z9889 Other specified postprocedural states: Secondary | ICD-10-CM | POA: Diagnosis not present

## 2013-07-05 DIAGNOSIS — M25569 Pain in unspecified knee: Secondary | ICD-10-CM | POA: Diagnosis not present

## 2013-07-10 DIAGNOSIS — R0602 Shortness of breath: Secondary | ICD-10-CM | POA: Diagnosis not present

## 2013-07-11 DIAGNOSIS — M545 Low back pain, unspecified: Secondary | ICD-10-CM | POA: Diagnosis not present

## 2013-07-18 DIAGNOSIS — M25549 Pain in joints of unspecified hand: Secondary | ICD-10-CM | POA: Diagnosis not present

## 2013-07-18 DIAGNOSIS — M5380 Other specified dorsopathies, site unspecified: Secondary | ICD-10-CM | POA: Diagnosis not present

## 2013-07-18 DIAGNOSIS — Z79899 Other long term (current) drug therapy: Secondary | ICD-10-CM | POA: Diagnosis not present

## 2013-07-18 DIAGNOSIS — G894 Chronic pain syndrome: Secondary | ICD-10-CM | POA: Diagnosis not present

## 2013-07-18 DIAGNOSIS — M79609 Pain in unspecified limb: Secondary | ICD-10-CM | POA: Diagnosis not present

## 2013-07-27 DIAGNOSIS — M546 Pain in thoracic spine: Secondary | ICD-10-CM | POA: Diagnosis not present

## 2013-07-27 DIAGNOSIS — M545 Low back pain, unspecified: Secondary | ICD-10-CM | POA: Diagnosis not present

## 2013-07-27 DIAGNOSIS — Z79899 Other long term (current) drug therapy: Secondary | ICD-10-CM | POA: Diagnosis not present

## 2013-07-27 DIAGNOSIS — G894 Chronic pain syndrome: Secondary | ICD-10-CM | POA: Diagnosis not present

## 2013-07-27 DIAGNOSIS — M5380 Other specified dorsopathies, site unspecified: Secondary | ICD-10-CM | POA: Diagnosis not present

## 2013-08-08 DIAGNOSIS — M76899 Other specified enthesopathies of unspecified lower limb, excluding foot: Secondary | ICD-10-CM | POA: Diagnosis not present

## 2013-08-18 DIAGNOSIS — M25569 Pain in unspecified knee: Secondary | ICD-10-CM | POA: Diagnosis not present

## 2013-08-18 DIAGNOSIS — M5380 Other specified dorsopathies, site unspecified: Secondary | ICD-10-CM | POA: Diagnosis not present

## 2013-08-18 DIAGNOSIS — Z79899 Other long term (current) drug therapy: Secondary | ICD-10-CM | POA: Diagnosis not present

## 2013-08-18 DIAGNOSIS — G894 Chronic pain syndrome: Secondary | ICD-10-CM | POA: Diagnosis not present

## 2013-08-18 DIAGNOSIS — M546 Pain in thoracic spine: Secondary | ICD-10-CM | POA: Diagnosis not present

## 2013-09-15 DIAGNOSIS — G894 Chronic pain syndrome: Secondary | ICD-10-CM | POA: Diagnosis not present

## 2013-09-15 DIAGNOSIS — M25569 Pain in unspecified knee: Secondary | ICD-10-CM | POA: Diagnosis not present

## 2013-09-15 DIAGNOSIS — M25549 Pain in joints of unspecified hand: Secondary | ICD-10-CM | POA: Diagnosis not present

## 2013-09-15 DIAGNOSIS — Z79899 Other long term (current) drug therapy: Secondary | ICD-10-CM | POA: Diagnosis not present

## 2013-10-07 DIAGNOSIS — L723 Sebaceous cyst: Secondary | ICD-10-CM | POA: Diagnosis not present

## 2013-10-13 DIAGNOSIS — Z79899 Other long term (current) drug therapy: Secondary | ICD-10-CM | POA: Diagnosis not present

## 2013-10-13 DIAGNOSIS — M546 Pain in thoracic spine: Secondary | ICD-10-CM | POA: Diagnosis not present

## 2013-10-13 DIAGNOSIS — M5380 Other specified dorsopathies, site unspecified: Secondary | ICD-10-CM | POA: Diagnosis not present

## 2013-10-13 DIAGNOSIS — G894 Chronic pain syndrome: Secondary | ICD-10-CM | POA: Diagnosis not present

## 2013-11-11 DIAGNOSIS — G894 Chronic pain syndrome: Secondary | ICD-10-CM | POA: Diagnosis not present

## 2013-11-11 DIAGNOSIS — M25569 Pain in unspecified knee: Secondary | ICD-10-CM | POA: Diagnosis not present

## 2013-11-11 DIAGNOSIS — Z79899 Other long term (current) drug therapy: Secondary | ICD-10-CM | POA: Diagnosis not present

## 2013-11-11 DIAGNOSIS — M25549 Pain in joints of unspecified hand: Secondary | ICD-10-CM | POA: Diagnosis not present

## 2013-12-05 DIAGNOSIS — J209 Acute bronchitis, unspecified: Secondary | ICD-10-CM | POA: Diagnosis not present

## 2013-12-07 DIAGNOSIS — R059 Cough, unspecified: Secondary | ICD-10-CM | POA: Diagnosis not present

## 2013-12-07 DIAGNOSIS — R05 Cough: Secondary | ICD-10-CM | POA: Diagnosis not present

## 2013-12-09 DIAGNOSIS — M25569 Pain in unspecified knee: Secondary | ICD-10-CM | POA: Diagnosis not present

## 2013-12-09 DIAGNOSIS — M25549 Pain in joints of unspecified hand: Secondary | ICD-10-CM | POA: Diagnosis not present

## 2013-12-09 DIAGNOSIS — G894 Chronic pain syndrome: Secondary | ICD-10-CM | POA: Diagnosis not present

## 2013-12-09 DIAGNOSIS — Z79899 Other long term (current) drug therapy: Secondary | ICD-10-CM | POA: Diagnosis not present

## 2014-01-06 DIAGNOSIS — Z79899 Other long term (current) drug therapy: Secondary | ICD-10-CM | POA: Diagnosis not present

## 2014-01-06 DIAGNOSIS — M545 Low back pain, unspecified: Secondary | ICD-10-CM | POA: Diagnosis not present

## 2014-01-06 DIAGNOSIS — G894 Chronic pain syndrome: Secondary | ICD-10-CM | POA: Diagnosis not present

## 2014-01-06 DIAGNOSIS — M25549 Pain in joints of unspecified hand: Secondary | ICD-10-CM | POA: Diagnosis not present

## 2014-02-02 DIAGNOSIS — M5408 Panniculitis affecting regions of neck and back, sacral and sacrococcygeal region: Secondary | ICD-10-CM | POA: Diagnosis not present

## 2014-02-02 DIAGNOSIS — G894 Chronic pain syndrome: Secondary | ICD-10-CM | POA: Diagnosis not present

## 2014-02-02 DIAGNOSIS — Z79899 Other long term (current) drug therapy: Secondary | ICD-10-CM | POA: Diagnosis not present

## 2014-02-02 DIAGNOSIS — M25569 Pain in unspecified knee: Secondary | ICD-10-CM | POA: Diagnosis not present

## 2014-03-03 DIAGNOSIS — Z79891 Long term (current) use of opiate analgesic: Secondary | ICD-10-CM | POA: Diagnosis not present

## 2014-03-03 DIAGNOSIS — M545 Low back pain: Secondary | ICD-10-CM | POA: Diagnosis not present

## 2014-03-03 DIAGNOSIS — M25562 Pain in left knee: Secondary | ICD-10-CM | POA: Diagnosis not present

## 2014-03-03 DIAGNOSIS — Z79899 Other long term (current) drug therapy: Secondary | ICD-10-CM | POA: Diagnosis not present

## 2014-03-03 DIAGNOSIS — G894 Chronic pain syndrome: Secondary | ICD-10-CM | POA: Diagnosis not present

## 2014-03-03 DIAGNOSIS — M5386 Other specified dorsopathies, lumbar region: Secondary | ICD-10-CM | POA: Diagnosis not present

## 2014-03-03 DIAGNOSIS — M79645 Pain in left finger(s): Secondary | ICD-10-CM | POA: Diagnosis not present

## 2014-03-07 DIAGNOSIS — M545 Low back pain: Secondary | ICD-10-CM | POA: Diagnosis not present

## 2014-03-31 DIAGNOSIS — Z79899 Other long term (current) drug therapy: Secondary | ICD-10-CM | POA: Diagnosis not present

## 2014-03-31 DIAGNOSIS — M5386 Other specified dorsopathies, lumbar region: Secondary | ICD-10-CM | POA: Diagnosis not present

## 2014-03-31 DIAGNOSIS — Z79891 Long term (current) use of opiate analgesic: Secondary | ICD-10-CM | POA: Diagnosis not present

## 2014-03-31 DIAGNOSIS — G894 Chronic pain syndrome: Secondary | ICD-10-CM | POA: Diagnosis not present

## 2014-03-31 DIAGNOSIS — M79646 Pain in unspecified finger(s): Secondary | ICD-10-CM | POA: Diagnosis not present

## 2014-04-28 DIAGNOSIS — M4697 Unspecified inflammatory spondylopathy, lumbosacral region: Secondary | ICD-10-CM | POA: Diagnosis not present

## 2014-04-28 DIAGNOSIS — G894 Chronic pain syndrome: Secondary | ICD-10-CM | POA: Diagnosis not present

## 2014-04-28 DIAGNOSIS — Z79899 Other long term (current) drug therapy: Secondary | ICD-10-CM | POA: Diagnosis not present

## 2014-04-28 DIAGNOSIS — M25569 Pain in unspecified knee: Secondary | ICD-10-CM | POA: Diagnosis not present

## 2014-05-08 DIAGNOSIS — Z1389 Encounter for screening for other disorder: Secondary | ICD-10-CM | POA: Diagnosis not present

## 2014-05-08 DIAGNOSIS — M545 Low back pain: Secondary | ICD-10-CM | POA: Diagnosis not present

## 2014-05-08 DIAGNOSIS — Z13220 Encounter for screening for lipoid disorders: Secondary | ICD-10-CM | POA: Diagnosis not present

## 2014-05-08 DIAGNOSIS — Z0000 Encounter for general adult medical examination without abnormal findings: Secondary | ICD-10-CM | POA: Diagnosis not present

## 2014-05-23 DIAGNOSIS — M4696 Unspecified inflammatory spondylopathy, lumbar region: Secondary | ICD-10-CM | POA: Diagnosis not present

## 2014-05-23 DIAGNOSIS — M47816 Spondylosis without myelopathy or radiculopathy, lumbar region: Secondary | ICD-10-CM | POA: Diagnosis not present

## 2014-05-23 DIAGNOSIS — M47817 Spondylosis without myelopathy or radiculopathy, lumbosacral region: Secondary | ICD-10-CM | POA: Diagnosis not present

## 2014-05-26 DIAGNOSIS — M25569 Pain in unspecified knee: Secondary | ICD-10-CM | POA: Diagnosis not present

## 2014-05-26 DIAGNOSIS — Z79899 Other long term (current) drug therapy: Secondary | ICD-10-CM | POA: Diagnosis not present

## 2014-05-26 DIAGNOSIS — G894 Chronic pain syndrome: Secondary | ICD-10-CM | POA: Diagnosis not present

## 2014-05-26 DIAGNOSIS — M4697 Unspecified inflammatory spondylopathy, lumbosacral region: Secondary | ICD-10-CM | POA: Diagnosis not present

## 2014-07-13 DIAGNOSIS — M545 Low back pain: Secondary | ICD-10-CM | POA: Diagnosis not present

## 2014-07-22 DIAGNOSIS — S60211A Contusion of right wrist, initial encounter: Secondary | ICD-10-CM | POA: Diagnosis not present

## 2014-07-22 DIAGNOSIS — F329 Major depressive disorder, single episode, unspecified: Secondary | ICD-10-CM | POA: Diagnosis not present

## 2014-07-22 DIAGNOSIS — F17200 Nicotine dependence, unspecified, uncomplicated: Secondary | ICD-10-CM | POA: Diagnosis not present

## 2014-07-22 DIAGNOSIS — S6991XA Unspecified injury of right wrist, hand and finger(s), initial encounter: Secondary | ICD-10-CM | POA: Diagnosis not present

## 2014-07-22 DIAGNOSIS — F419 Anxiety disorder, unspecified: Secondary | ICD-10-CM | POA: Diagnosis not present

## 2014-07-22 DIAGNOSIS — S63501A Unspecified sprain of right wrist, initial encounter: Secondary | ICD-10-CM | POA: Diagnosis not present

## 2014-07-22 DIAGNOSIS — M25531 Pain in right wrist: Secondary | ICD-10-CM | POA: Diagnosis not present

## 2014-07-22 DIAGNOSIS — Z79899 Other long term (current) drug therapy: Secondary | ICD-10-CM | POA: Diagnosis not present

## 2014-07-22 DIAGNOSIS — F4310 Post-traumatic stress disorder, unspecified: Secondary | ICD-10-CM | POA: Diagnosis not present

## 2014-07-22 DIAGNOSIS — W010XXA Fall on same level from slipping, tripping and stumbling without subsequent striking against object, initial encounter: Secondary | ICD-10-CM | POA: Diagnosis not present

## 2014-07-24 DIAGNOSIS — F4310 Post-traumatic stress disorder, unspecified: Secondary | ICD-10-CM | POA: Diagnosis not present

## 2014-07-24 DIAGNOSIS — Z720 Tobacco use: Secondary | ICD-10-CM | POA: Diagnosis not present

## 2014-07-24 DIAGNOSIS — S60211A Contusion of right wrist, initial encounter: Secondary | ICD-10-CM | POA: Diagnosis not present

## 2014-07-24 DIAGNOSIS — W010XXA Fall on same level from slipping, tripping and stumbling without subsequent striking against object, initial encounter: Secondary | ICD-10-CM | POA: Diagnosis not present

## 2014-07-24 DIAGNOSIS — Z79899 Other long term (current) drug therapy: Secondary | ICD-10-CM | POA: Diagnosis not present

## 2014-07-24 DIAGNOSIS — Y92019 Unspecified place in single-family (private) house as the place of occurrence of the external cause: Secondary | ICD-10-CM | POA: Diagnosis not present

## 2014-07-24 DIAGNOSIS — S63501A Unspecified sprain of right wrist, initial encounter: Secondary | ICD-10-CM | POA: Diagnosis not present

## 2014-07-24 DIAGNOSIS — K219 Gastro-esophageal reflux disease without esophagitis: Secondary | ICD-10-CM | POA: Diagnosis not present

## 2014-07-24 DIAGNOSIS — F418 Other specified anxiety disorders: Secondary | ICD-10-CM | POA: Diagnosis not present

## 2014-08-01 DIAGNOSIS — G894 Chronic pain syndrome: Secondary | ICD-10-CM | POA: Diagnosis not present

## 2014-08-01 DIAGNOSIS — Z79899 Other long term (current) drug therapy: Secondary | ICD-10-CM | POA: Diagnosis not present

## 2014-09-05 DIAGNOSIS — M545 Low back pain: Secondary | ICD-10-CM | POA: Diagnosis not present

## 2014-11-03 DIAGNOSIS — M545 Low back pain: Secondary | ICD-10-CM | POA: Diagnosis not present

## 2014-11-03 DIAGNOSIS — I10 Essential (primary) hypertension: Secondary | ICD-10-CM | POA: Diagnosis not present

## 2014-11-30 ENCOUNTER — Encounter (HOSPITAL_COMMUNITY): Payer: Self-pay

## 2014-11-30 ENCOUNTER — Emergency Department (HOSPITAL_COMMUNITY)
Admission: EM | Admit: 2014-11-30 | Discharge: 2014-11-30 | Disposition: A | Discharge status: Home / Self Care | Payer: Medicare Other | Attending: Emergency Medicine | Admitting: Emergency Medicine

## 2014-11-30 ENCOUNTER — Emergency Department (HOSPITAL_COMMUNITY): Payer: Medicare Other

## 2014-11-30 DIAGNOSIS — Z79899 Other long term (current) drug therapy: Secondary | ICD-10-CM | POA: Diagnosis not present

## 2014-11-30 DIAGNOSIS — M79672 Pain in left foot: Secondary | ICD-10-CM | POA: Diagnosis not present

## 2014-11-30 DIAGNOSIS — Y9339 Activity, other involving climbing, rappelling and jumping off: Secondary | ICD-10-CM | POA: Diagnosis not present

## 2014-11-30 DIAGNOSIS — S93602A Unspecified sprain of left foot, initial encounter: Secondary | ICD-10-CM | POA: Diagnosis not present

## 2014-11-30 DIAGNOSIS — S99922A Unspecified injury of left foot, initial encounter: Secondary | ICD-10-CM | POA: Diagnosis not present

## 2014-11-30 DIAGNOSIS — Y998 Other external cause status: Secondary | ICD-10-CM | POA: Insufficient documentation

## 2014-11-30 DIAGNOSIS — Z720 Tobacco use: Secondary | ICD-10-CM | POA: Insufficient documentation

## 2014-11-30 DIAGNOSIS — Z791 Long term (current) use of non-steroidal anti-inflammatories (NSAID): Secondary | ICD-10-CM | POA: Diagnosis not present

## 2014-11-30 DIAGNOSIS — Y9289 Other specified places as the place of occurrence of the external cause: Secondary | ICD-10-CM | POA: Insufficient documentation

## 2014-11-30 DIAGNOSIS — Z8719 Personal history of other diseases of the digestive system: Secondary | ICD-10-CM | POA: Insufficient documentation

## 2014-11-30 DIAGNOSIS — X58XXXA Exposure to other specified factors, initial encounter: Secondary | ICD-10-CM | POA: Insufficient documentation

## 2014-11-30 HISTORY — DX: Crohn's disease, unspecified, without complications: K50.90

## 2014-11-30 MED ORDER — IBUPROFEN 800 MG PO TABS
800.0000 mg | ORAL_TABLET | Freq: Once | ORAL | Status: AC
  Administered 2014-11-30: 800 mg via ORAL
  Filled 2014-11-30: qty 1

## 2014-11-30 NOTE — ED Provider Notes (Signed)
CSN: [id number]     Arrival date & time 11/30/14  0133 History   First MD Initiated Contact with Patient 11/30/14 0155     Chief Complaint  Patient presents with  . Foot Pain     Patient is a 44 y.o. male presenting with lower extremity pain. The history is provided by the patient.  Foot Pain This is a new problem. The current episode started more than 2 days ago. The problem occurs daily. The problem has been gradually worsening. Pertinent negatives include no headaches. The symptoms are aggravated by walking. The symptoms are relieved by rest.  pt jumped out of a stationary truck and injured his left foot No head injury, no LOC He has no other complaints  Past Medical History  Diagnosis Date  . Post traumatic stress disorder (PTSD)   . Crohn disease    Past Surgical History  Procedure Laterality Date  . Cholecystectomy    . Knee surgery    . Thumb surgery     Family History  Problem Relation Age of Onset  . Hypertension Mother   . Heart failure Mother   . COPD Mother    Social History  Substance Use Topics  . Smoking status: Current Every Day Smoker -- 1.00 packs/day  . Smokeless tobacco: Never Used  . Alcohol Use: No    Review of Systems  Musculoskeletal: Positive for arthralgias.  Neurological: Negative for headaches.      Allergies  Baclofen; Fentanyl; Onion; and Ultram  Home Medications   Prior to Admission medications   Medication Sig Start Date End Date Taking? Authorizing Provider  diazepam (VALIUM) 10 MG tablet Take 10 mg by mouth 4 (four) times daily. And take 2 tablets at bedtime    Yes Historical Provider, MD  gabapentin (NEURONTIN) 800 MG tablet Take 800 mg by mouth 2 (two) times daily.     Yes Historical Provider, MD  mirtazapine (REMERON) 30 MG tablet Take 30 mg by mouth at bedtime.     Yes Historical Provider, MD  naproxen sodium (ANAPROX) 220 MG tablet Take 220 mg by mouth 2 (two) times daily with a meal.   Yes Historical Provider, MD   cyclobenzaprine (FLEXERIL) 5 MG tablet Take 5 mg by mouth 3 (three) times daily as needed.    Historical Provider, MD  hydrocortisone 1 % cream Apply 1 application topically daily as needed. For rash on arms     Historical Provider, MD  morphine (MS CONTIN) 15 MG 12 hr tablet Take 15 mg by mouth 2 (two) times daily.      Historical Provider, MD  omeprazole (PRILOSEC) 20 MG capsule Take 20 mg by mouth 2 (two) times daily.      Historical Provider, MD  ondansetron (ZOFRAN) 4 MG tablet Take 4 mg by mouth every 8 (eight) hours as needed. For nausea     Historical Provider, MD  venlafaxine (EFFEXOR) 100 MG tablet Take 112.5 mg by mouth daily.     Historical Provider, MD   BP 131/87 mmHg  Pulse 74  Temp(Src) 98.3 F (36.8 C) (Oral)  Resp 16  Ht 5\' 6"  (1.676 m)  Wt 128 lb (58.06 kg)  BMI 20.67 kg/m2  SpO2 99% Physical Exam CONSTITUTIONAL: Well developed/well nourished HEAD: Normocephalic/atraumatic EYES: EOMI ENMT: Mucous membranes moist NECK: supple no meningeal signs SPINE/BACK:entire spine nontender LUNGS:  no apparent distress NEURO: Pt is awake/alert/appropriate, moves all extremitiesx4.  EXTREMITIES: pulses normal/equal, full ROM.  Tenderness to dorsal surface of left foot.  No deformity/edema noted.  Mild tenderness to left lateral malleolus.  Left achilles intact.  No other focal tenderness to left LE SKIN: warm, color normal PSYCH: no abnormalities of mood noted, alert and oriented to situation  ED Course  Procedures  Imaging Review Dg Foot Complete Left  11/30/2014   CLINICAL DATA:  Acute onset of left foot pain, mostly about the great toe, after falling off truck. Initial encounter.  EXAM: LEFT FOOT - COMPLETE 3+ VIEW  COMPARISON:  None.  FINDINGS: There is no evidence of fracture or dislocation. The joint spaces are preserved. There is no evidence of talar subluxation; the subtalar joint is unremarkable in appearance. An os peroneum is noted.  No significant soft tissue  abnormalities are seen.  IMPRESSION: 1. No evidence of fracture or dislocation. 2. Os peroneum noted.   Electronically Signed   By: Garald Balding M.D.   On: 11/30/2014 02:22   I have personally reviewed and evaluated these image results as part of my medical decision-making.  Medications  ibuprofen (ADVIL,MOTRIN) tablet 800 mg (800 mg Oral Given 11/30/14 0229)    Crutches, f/u with ortho if no improvement next week MDM   Final diagnoses:  Sprain of left foot, initial encounter    Nursing notes including past medical history and social history reviewed and considered in documentation xrays/imaging reviewed by myself and considered during evaluation     Ripley Fraise, MD 11/30/14 0230

## 2014-11-30 NOTE — ED Notes (Signed)
Pt states he injured his left foot by jumping off of a moving truck last Friday.  Pt states the pain and swelling to the top of the left foot.

## 2014-11-30 NOTE — Discharge Instructions (Signed)
Foot Sprain The muscles and cord like structures which attach muscle to bone (tendons) that surround the feet are made up of units. A foot sprain can occur at the weakest spot in any of these units. This condition is most often caused by injury to or overuse of the foot, as from playing contact sports, or aggravating a previous injury, or from poor conditioning, or obesity. SYMPTOMS  Pain with movement of the foot.  Tenderness and swelling at the injury site.  Loss of strength is present in moderate or severe sprains. THE THREE GRADES OR SEVERITY OF FOOT SPRAIN ARE:  Mild (Grade I): Slightly pulled muscle without tearing of muscle or tendon fibers or loss of strength.  Moderate (Grade II): Tearing of fibers in a muscle, tendon, or at the attachment to bone, with small decrease in strength.  Severe (Grade III): Rupture of the muscle-tendon-bone attachment, with separation of fibers. Severe sprain requires surgical repair. Often repeating (chronic) sprains are caused by overuse. Sudden (acute) sprains are caused by direct injury or over-use. DIAGNOSIS  Diagnosis of this condition is usually by your own observation. If problems continue, a caregiver may be required for further evaluation and treatment. X-rays may be required to make sure there are not breaks in the bones (fractures) present. Continued problems may require physical therapy for treatment. PREVENTION  Use strength and conditioning exercises appropriate for your sport.  Warm up properly prior to working out.  Use athletic shoes that are made for the sport you are participating in.  Allow adequate time for healing. Early return to activities makes repeat injury more likely, and can lead to an unstable arthritic foot that can result in prolonged disability. Mild sprains generally heal in 3 to 10 days, with moderate and severe sprains taking 2 to 10 weeks. Your caregiver can help you determine the proper time required for  healing. HOME CARE INSTRUCTIONS   Apply ice to the injury for 15-20 minutes, 03-04 times per day. Put the ice in a plastic bag and place a towel between the bag of ice and your skin.  An elastic wrap (like an Ace bandage) may be used to keep swelling down.  Keep foot above the level of the heart, or at least raised on a footstool, when swelling and pain are present.  Try to avoid use other than gentle range of motion while the foot is painful. Do not resume use until instructed by your caregiver. Then begin use gradually, not increasing use to the point of pain. If pain does develop, decrease use and continue the above measures, gradually increasing activities that do not cause discomfort, until you gradually achieve normal use.  Use crutches if and as instructed, and for the length of time instructed.  Keep injured foot and ankle wrapped between treatments.  Massage foot and ankle for comfort and to keep swelling down. Massage from the toes up towards the knee.  Only take over-the-counter or prescription medicines for pain, discomfort, or fever as directed by your caregiver. SEEK IMMEDIATE MEDICAL CARE IF:   Your pain and swelling increase, or pain is not controlled with medications.  You have loss of feeling in your foot or your foot turns cold or blue.  You develop new, unexplained symptoms, or an increase of the symptoms that brought you to your caregiver. MAKE SURE YOU:   Understand these instructions.  Will watch your condition.  Will get help right away if you are not doing well or get worse. Document Released:   09/20/2001 Document Revised: 06/23/2011 Document Reviewed: 11/18/2007 Exit[employer] Patient Information 2015 [employer]. This information is not intended to replace advice given to you by your health care provider. Make sure you discuss any questions you have with your health care provider.  

## 2014-12-05 ENCOUNTER — Encounter: Payer: Self-pay | Admitting: Gastroenterology

## 2014-12-06 DIAGNOSIS — Z720 Tobacco use: Secondary | ICD-10-CM | POA: Diagnosis not present

## 2014-12-06 DIAGNOSIS — Z6820 Body mass index (BMI) 20.0-20.9, adult: Secondary | ICD-10-CM | POA: Diagnosis not present

## 2014-12-06 DIAGNOSIS — M79672 Pain in left foot: Secondary | ICD-10-CM | POA: Diagnosis not present

## 2014-12-14 DIAGNOSIS — M79672 Pain in left foot: Secondary | ICD-10-CM | POA: Diagnosis not present

## 2014-12-14 DIAGNOSIS — S92002A Unspecified fracture of left calcaneus, initial encounter for closed fracture: Secondary | ICD-10-CM | POA: Diagnosis not present

## 2014-12-14 DIAGNOSIS — Z6820 Body mass index (BMI) 20.0-20.9, adult: Secondary | ICD-10-CM | POA: Diagnosis not present

## 2014-12-14 DIAGNOSIS — Z720 Tobacco use: Secondary | ICD-10-CM | POA: Diagnosis not present

## 2014-12-19 DIAGNOSIS — Z720 Tobacco use: Secondary | ICD-10-CM | POA: Diagnosis not present

## 2014-12-19 DIAGNOSIS — Z6820 Body mass index (BMI) 20.0-20.9, adult: Secondary | ICD-10-CM | POA: Diagnosis not present

## 2015-01-04 DIAGNOSIS — S92002D Unspecified fracture of left calcaneus, subsequent encounter for fracture with routine healing: Secondary | ICD-10-CM | POA: Diagnosis not present

## 2015-01-04 DIAGNOSIS — M79672 Pain in left foot: Secondary | ICD-10-CM | POA: Diagnosis not present

## 2015-01-04 DIAGNOSIS — I10 Essential (primary) hypertension: Secondary | ICD-10-CM | POA: Diagnosis not present

## 2015-01-04 DIAGNOSIS — Z6820 Body mass index (BMI) 20.0-20.9, adult: Secondary | ICD-10-CM | POA: Diagnosis not present

## 2015-01-04 DIAGNOSIS — M545 Low back pain: Secondary | ICD-10-CM | POA: Diagnosis not present

## 2015-01-04 DIAGNOSIS — Z720 Tobacco use: Secondary | ICD-10-CM | POA: Diagnosis not present

## 2015-02-01 DIAGNOSIS — Z6820 Body mass index (BMI) 20.0-20.9, adult: Secondary | ICD-10-CM | POA: Diagnosis not present

## 2015-02-01 DIAGNOSIS — S92022D Displaced fracture of anterior process of left calcaneus, subsequent encounter for fracture with routine healing: Secondary | ICD-10-CM | POA: Diagnosis not present

## 2015-02-01 DIAGNOSIS — Z720 Tobacco use: Secondary | ICD-10-CM | POA: Diagnosis not present

## 2015-03-13 DIAGNOSIS — Z720 Tobacco use: Secondary | ICD-10-CM | POA: Diagnosis not present

## 2015-03-13 DIAGNOSIS — Z6820 Body mass index (BMI) 20.0-20.9, adult: Secondary | ICD-10-CM | POA: Diagnosis not present

## 2015-03-13 DIAGNOSIS — S92022D Displaced fracture of anterior process of left calcaneus, subsequent encounter for fracture with routine healing: Secondary | ICD-10-CM | POA: Diagnosis not present

## 2015-04-04 DIAGNOSIS — I10 Essential (primary) hypertension: Secondary | ICD-10-CM | POA: Diagnosis not present

## 2015-04-04 DIAGNOSIS — M545 Low back pain: Secondary | ICD-10-CM | POA: Diagnosis not present

## 2015-04-10 DIAGNOSIS — Z720 Tobacco use: Secondary | ICD-10-CM | POA: Diagnosis not present

## 2015-04-10 DIAGNOSIS — Z6821 Body mass index (BMI) 21.0-21.9, adult: Secondary | ICD-10-CM | POA: Diagnosis not present

## 2015-04-10 DIAGNOSIS — S92022D Displaced fracture of anterior process of left calcaneus, subsequent encounter for fracture with routine healing: Secondary | ICD-10-CM | POA: Diagnosis not present

## 2015-05-22 ENCOUNTER — Ambulatory Visit (INDEPENDENT_AMBULATORY_CARE_PROVIDER_SITE_OTHER): Payer: Medicare Other | Admitting: Orthopaedic Surgery

## 2015-05-22 ENCOUNTER — Encounter: Payer: Self-pay | Admitting: Orthopaedic Surgery

## 2015-05-22 VITALS — BP 75/53 | HR 67 | Temp 93.4°F | Ht 64.0 in | Wt 127.4 lb

## 2015-05-22 DIAGNOSIS — S92002D Unspecified fracture of left calcaneus, subsequent encounter for fracture with routine healing: Secondary | ICD-10-CM | POA: Diagnosis not present

## 2015-05-22 DIAGNOSIS — S92009A Unspecified fracture of unspecified calcaneus, initial encounter for closed fracture: Secondary | ICD-10-CM | POA: Insufficient documentation

## 2015-05-22 NOTE — Progress Notes (Signed)
Patient [id number] Joshua Hale, male DOB:[birth year], 45 y.o. [id number]  Chief Complaint  Patient presents with  . Follow-up    left heel still throbbing and stinging    HPI  CORAN TARDIE is a 45 y.o. male for follow-up of the left heel fracture.  He has gotten slightly better but his pain comes and goes.  He is walking better.  He has a bad left knee and that contributes to his left limp.  He has no new trauma, no numbness.  Foot Pain This is a chronic problem. The current episode started more than 1 month ago. The problem occurs every several days. The problem has been waxing and waning. Associated symptoms include arthralgias (left knee and left heel). He has tried acetaminophen, ice, immobilization and NSAIDs for the symptoms. The treatment provided mild relief.    Review of Systems Review of Systems  Musculoskeletal: Positive for arthralgias (left knee and left heel).  All other systems reviewed and are negative.   Past Medical History  Diagnosis Date  . Post traumatic stress disorder (PTSD)   . Crohn disease [facility])     Past Surgical History  Procedure Laterality Date  . Cholecystectomy    . Knee surgery    . Thumb surgery      Family History  Problem Relation Age of Onset  . Hypertension Mother   . Heart failure Mother   . COPD Mother     Social History Social History  Substance Use Topics  . Smoking status: Current Every Day Smoker -- 1.00 packs/day  . Smokeless tobacco: Never Used  . Alcohol Use: No    Allergies  Allergen Reactions  . Baclofen Swelling  . Fentanyl Hives  . Onion Swelling  . Ultram [Tramadol] Nausea Only    Current Outpatient Prescriptions  Medication Sig Dispense Refill  . diazepam (VALIUM) 10 MG tablet Take 10 mg by mouth 4 (four) times daily. And take 2 tablets at bedtime     . gabapentin (NEURONTIN) 800 MG tablet Take 800 mg by mouth 2 (two) times daily.      . mirtazapine (REMERON) 30 MG tablet Take 30 mg by mouth at bedtime.      .  naproxen sodium (ANAPROX) 220 MG tablet Take 220 mg by mouth 2 (two) times daily with a meal.     No current facility-administered medications for this visit.     Physical Exam  Blood pressure 75/53, pulse 67, temperature 93.4 F (34.1 C), height 5\' 4"  (1.626 m), weight 127 lb 6.4 oz (57.788 kg).  Constitutional: overall normal hygiene, normal nutrition, well developed, normal grooming, normal body habitus. Assistive device:none  Musculoskeletal: gait and station Limp left, muscle tone and strength are normal, no tremors or atrophy is present.  .  Neurological: coordination overall normal.  Deep tendon reflex/nerve stretch intact.  Sensation normal.  Cranial nerves II-XII intact.   Skin:normal overall no scars, lesions, ulcers or rash es. No psoriasis.  Psychiatric: Alert and oriented x 3.  Recent memory intact, remote memory unclear.  Normal mood and affect. Well groomed.  Good eye contact.  Cardiovascular: overall no swelling, no varicosities, no edema bilaterally, normal temperatures of the legs and arms, no clubbing, cyanosis and good capillary refill.  Lymphatic: palpation is normal.  Encounter Diagnosis  Name Primary?  . Heel fracture, left, with routine healing, subsequent encounter Yes     Extremities:the left heel has no pain today.  Gait is a slight limp to the left. Inspection normal  Strength and tone normal Range of motion full of the left ankle  Additional services performed: none  PLAN Call if any problems.  Precautions discussed.  Continue current medications.   Return to clinic six weeks.

## 2015-05-22 NOTE — Patient Instructions (Signed)
See in six weeks.

## 2015-05-31 ENCOUNTER — Telehealth: Payer: Self-pay | Admitting: Orthopaedic Surgery

## 2015-05-31 MED ORDER — HYDROCODONE-ACETAMINOPHEN 7.5-325 MG PO TABS: 1.0000 | ORAL_TABLET | ORAL | Status: DC | PRN

## 2015-05-31 NOTE — Telephone Encounter (Signed)
Rx printed

## 2015-05-31 NOTE — Telephone Encounter (Signed)
Prescription available, called patient, no answer 

## 2015-05-31 NOTE — Telephone Encounter (Signed)
Patient requesting refill of Hydrocodone 7.5/325mg  Qty 120 tablets

## 2015-06-05 DIAGNOSIS — M545 Low back pain: Secondary | ICD-10-CM | POA: Diagnosis not present

## 2015-06-05 DIAGNOSIS — Z0000 Encounter for general adult medical examination without abnormal findings: Secondary | ICD-10-CM | POA: Diagnosis not present

## 2015-06-05 DIAGNOSIS — Z1389 Encounter for screening for other disorder: Secondary | ICD-10-CM | POA: Diagnosis not present

## 2015-06-05 DIAGNOSIS — I10 Essential (primary) hypertension: Secondary | ICD-10-CM | POA: Diagnosis not present

## 2015-06-28 ENCOUNTER — Telehealth: Payer: Self-pay | Admitting: Orthopaedic Surgery

## 2015-07-02 MED ORDER — HYDROCODONE-ACETAMINOPHEN 5-325 MG PO TABS: 1.0000 | ORAL_TABLET | ORAL | Status: DC | PRN

## 2015-07-02 NOTE — Telephone Encounter (Signed)
Rx done. 

## 2015-07-03 ENCOUNTER — Ambulatory Visit (INDEPENDENT_AMBULATORY_CARE_PROVIDER_SITE_OTHER): Payer: Medicare Other | Admitting: Orthopaedic Surgery

## 2015-07-03 VITALS — BP 121/66 | HR 100 | Temp 98.1°F | Ht 64.0 in | Wt 121.8 lb

## 2015-07-03 DIAGNOSIS — M79672 Pain in left foot: Secondary | ICD-10-CM | POA: Diagnosis not present

## 2015-07-03 NOTE — Progress Notes (Signed)
Patient [id number] Joshua Hale, male DOB:Dec 16, 1970, 45 y.o. [id number]  Chief Complaint  Patient presents with  . Follow-up    Left heel    HPI  Joshua Hale is a 45 y.o. male who is recovering from a left heel fracture. He still has some burning type pain at times.  He has no new trauma, no redness, no swelling.  He is wearing regular shoes now.  I told him to get an insert to use in the shoe.  HPI  Body mass index is 20.9 kg/(m^2).  Review of Systems  Constitutional:       Patient does not have Diabetes Mellitus. Patient does not have hypertension. Patient has COPD or shortness of breath. Patient does not have BMI > 35. Patient has current smoking history.  HENT: Negative for congestion.   Respiratory: Positive for cough and shortness of breath.   Endocrine: Positive for cold intolerance.  Musculoskeletal: Positive for myalgias, joint swelling, arthralgias and gait problem.  Allergic/Immunologic: Positive for environmental allergies.    Past Medical History  Diagnosis Date  . Post traumatic stress disorder (PTSD)   . Crohn disease [facility])     Past Surgical History  Procedure Laterality Date  . Cholecystectomy    . Knee surgery    . Thumb surgery      Family History  Problem Relation Age of Onset  . Hypertension Mother   . Heart failure Mother   . COPD Mother     Social History Social History  Substance Use Topics  . Smoking status: Current Every Day Smoker -- 1.00 packs/day  . Smokeless tobacco: Never Used  . Alcohol Use: No    Allergies  Allergen Reactions  . Baclofen Swelling  . Fentanyl Hives  . Onion Swelling  . Ultram [Tramadol] Nausea Only    Current Outpatient Prescriptions  Medication Sig Dispense Refill  . diazepam (VALIUM) 10 MG tablet Take 10 mg by mouth 4 (four) times daily. And take 2 tablets at bedtime     . gabapentin (NEURONTIN) 800 MG tablet Take 800 mg by mouth 2 (two) times daily.      Marland Kitchen HYDROcodone-acetaminophen (NORCO/VICODIN)  5-325 MG tablet Take 1 tablet by mouth every 4 (four) hours as needed for moderate pain (Must last 30 days.  Do not take and drive a car or use machinery.). 120 tablet 0  . mirtazapine (REMERON) 30 MG tablet Take 30 mg by mouth at bedtime.      . naproxen sodium (ANAPROX) 220 MG tablet Take 220 mg by mouth 2 (two) times daily with a meal.     No current facility-administered medications for this visit.     Physical Exam  Blood pressure 121/66, pulse 100, temperature 98.1 F (36.7 C), height 5\' 4"  (1.626 m), weight 121 lb 12.8 oz (55.248 kg).  Constitutional: overall normal hygiene, normal nutrition, well developed, normal grooming, normal body habitus. Assistive device:none  Musculoskeletal: gait and station Limp very slight left, muscle tone and strength are normal, no tremors or atrophy is present.  .  Neurological: coordination overall normal.  Deep tendon reflex/nerve stretch intact.  Sensation normal.  Cranial nerves II-XII intact.   Skin:   Has scars of the left knee but otherwise overall no scars, lesions, ulcers or rashes. No psoriasis.  Psychiatric: Alert and oriented x 3.  Recent memory intact, remote memory unclear.  Normal mood and affect. Well groomed.  Good eye contact.  Cardiovascular: overall no swelling, no varicosities, no edema bilaterally, normal temperatures  of the legs and arms, no clubbing, cyanosis and good capillary refill.  Lymphatic: palpation is normal.   Extremities:he has tenderness of the medial left heel area.  He has no redness. Inspection normal of the left ankle Strength and tone normal left ankle Range of motion full left ankle  Additional services performed: he has left knee pain chronically.  I need to get the records before I can examine his knee.  The patient has been educated about the nature of the problem(s) and counseled on treatment options.  The patient appeared to understand what I have discussed and is in agreement with  it.  PLAN Call if any problems.  Precautions discussed.  Continue current medications.   Return to clinic 6 weeks.

## 2015-07-30 ENCOUNTER — Telehealth: Payer: Self-pay | Admitting: Orthopaedic Surgery

## 2015-07-30 NOTE — Telephone Encounter (Signed)
Patient called to request refill of medication: HYDROcodone-acetaminophen (NORCO) 7.5-325 MG tablet [id number] - quantity 120. Patient ph# [phone]

## 2015-07-31 MED ORDER — HYDROCODONE-ACETAMINOPHEN 5-325 MG PO TABS: 1.0000 | ORAL_TABLET | ORAL | Status: DC | PRN

## 2015-07-31 NOTE — Telephone Encounter (Signed)
Rx done. 

## 2015-08-02 DIAGNOSIS — I10 Essential (primary) hypertension: Secondary | ICD-10-CM | POA: Diagnosis not present

## 2015-08-02 DIAGNOSIS — M545 Low back pain: Secondary | ICD-10-CM | POA: Diagnosis not present

## 2015-08-14 ENCOUNTER — Encounter: Payer: Self-pay | Admitting: Orthopaedic Surgery

## 2015-08-14 ENCOUNTER — Ambulatory Visit (INDEPENDENT_AMBULATORY_CARE_PROVIDER_SITE_OTHER): Payer: Medicare Other | Admitting: Orthopaedic Surgery

## 2015-08-14 VITALS — BP 107/68 | HR 75 | Temp 97.5°F | Ht 64.0 in | Wt 121.8 lb

## 2015-08-14 DIAGNOSIS — S92002D Unspecified fracture of left calcaneus, subsequent encounter for fracture with routine healing: Secondary | ICD-10-CM | POA: Diagnosis not present

## 2015-08-14 DIAGNOSIS — M79672 Pain in left foot: Secondary | ICD-10-CM | POA: Diagnosis not present

## 2015-08-14 NOTE — Progress Notes (Signed)
Patient [id number] Joshua Hale, male DOB:12-14-1970, 45 y.o. [id number]  Chief Complaint  Patient presents with  . Follow-up    follow up left heel    HPI  Joshua Hale is a 45 y.o. male who has had fracture of the left heel.  He is doing well.  He has no pain.  He wears regular shoes.  He has no new trauma.  He has chronic knee pain treated at [facility].   HPI  Body mass index is 20.9 kg/(m^2).  Review of Systems  Constitutional:       Patient does not have Diabetes Mellitus. Patient does not have hypertension. Patient has COPD or shortness of breath. Patient does not have BMI > 35. Patient has current smoking history.  HENT: Negative for congestion.   Respiratory: Positive for cough and shortness of breath.   Endocrine: Positive for cold intolerance.  Musculoskeletal: Positive for myalgias, joint swelling, arthralgias and gait problem.  Allergic/Immunologic: Positive for environmental allergies.    Past Medical History  Diagnosis Date  . Post traumatic stress disorder (PTSD)   . Crohn disease [facility])     Past Surgical History  Procedure Laterality Date  . Cholecystectomy    . Knee surgery    . Thumb surgery      Family History  Problem Relation Age of Onset  . Hypertension Mother   . Heart failure Mother   . COPD Mother     Social History Social History  Substance Use Topics  . Smoking status: Current Every Day Smoker -- 1.00 packs/day  . Smokeless tobacco: Never Used  . Alcohol Use: No    Allergies  Allergen Reactions  . Baclofen Swelling  . Fentanyl Hives  . Onion Swelling  . Ultram [Tramadol] Nausea Only    Current Outpatient Prescriptions  Medication Sig Dispense Refill  . diazepam (VALIUM) 10 MG tablet Take 10 mg by mouth 4 (four) times daily. And take 2 tablets at bedtime     . gabapentin (NEURONTIN) 800 MG tablet Take 800 mg by mouth 2 (two) times daily.      Marland Kitchen HYDROcodone-acetaminophen (NORCO/VICODIN) 5-325 MG tablet Take 1 tablet  by mouth every 4 (four) hours as needed for moderate pain (Must last 30 days.  Do not take and drive a car or use machinery.). 120 tablet 0  . mirtazapine (REMERON) 30 MG tablet Take 30 mg by mouth at bedtime.      . naproxen sodium (ANAPROX) 220 MG tablet Take 220 mg by mouth 2 (two) times daily with a meal.     No current facility-administered medications for this visit.     Physical Exam  Blood pressure 107/68, pulse 75, temperature 97.5 F (36.4 C), height 5\' 4"  (1.626 m), weight 121 lb 12.8 oz (55.248 kg).  Constitutional: overall poor hygiene, normal nutrition, well developed, unkempt grooming, normal body habitus. Assistive device:none  Musculoskeletal: gait and station Limp none, muscle tone and strength are normal, no tremors or atrophy is present.  .  Neurological: coordination overall normal.  Deep tendon reflex/nerve stretch intact.  Sensation normal.  Cranial nerves II-XII intact.   Skin:   normal overall no scars, lesions, ulcers or rashes. No psoriasis.  Psychiatric: Alert and oriented x 3.  Recent memory intact, remote memory unclear.  Normal mood and affect. Well groomed.  Good eye contact.  Cardiovascular: overall no swelling, no varicosities, no edema bilaterally, normal temperatures of the legs and arms, no clubbing, cyanosis and good capillary refill.  Lymphatic: palpation is normal.   Extremities:left heel with no pain, no limp.  Right side normal Inspection normal Strength and tone normal Range of motion full both ankles.   The patient has been educated about the nature of the problem(s) and counseled on treatment options.  The patient appeared to understand what I have discussed and is in agreement with it.  Encounter Diagnoses  Name Primary?  . Heel pain, left Yes  . Heel fracture, left, with routine healing, subsequent encounter     PLAN Call if any problems.  Precautions discussed.  Continue current medications.   Return to clinic  PRN

## 2015-08-29 ENCOUNTER — Telehealth: Payer: Self-pay | Admitting: Orthopaedic Surgery

## 2015-08-29 MED ORDER — HYDROCODONE-ACETAMINOPHEN 5-325 MG PO TABS: 1.0000 | ORAL_TABLET | ORAL | Status: DC | PRN

## 2015-08-29 NOTE — Telephone Encounter (Signed)
Patient called and requested a refill on Hydrocodone-Acetaminophen 5-325 mgs  Qty  120  Sig: Take 1 tablet by mouth every 4 (four) hours as needed for moderate pain (Must last 30 days.  Do not take and drive a car or use machinery.). °

## 2015-08-29 NOTE — Telephone Encounter (Signed)
Rx done. 

## 2015-09-13 ENCOUNTER — Ambulatory Visit: Payer: Medicare Other | Admitting: Orthopaedic Surgery

## 2015-09-19 ENCOUNTER — Encounter: Payer: Self-pay | Admitting: Orthopaedic Surgery

## 2015-09-19 ENCOUNTER — Ambulatory Visit (INDEPENDENT_AMBULATORY_CARE_PROVIDER_SITE_OTHER): Payer: Medicare Other | Admitting: Orthopaedic Surgery

## 2015-09-19 ENCOUNTER — Ambulatory Visit (INDEPENDENT_AMBULATORY_CARE_PROVIDER_SITE_OTHER): Payer: Medicare Other

## 2015-09-19 ENCOUNTER — Other Ambulatory Visit (HOSPITAL_COMMUNITY): Payer: Self-pay | Admitting: Orthopaedic Surgery

## 2015-09-19 VITALS — BP 120/87 | HR 110 | Temp 98.6°F | Ht 64.0 in | Wt 127.0 lb

## 2015-09-19 DIAGNOSIS — M25562 Pain in left knee: Secondary | ICD-10-CM

## 2015-09-19 NOTE — Patient Instructions (Signed)
Return after CT left knee.

## 2015-09-19 NOTE — Progress Notes (Signed)
Patient [id number] Joshua Hale, male DOB:01-01-1971, 45 y.o. [id number]  Chief Complaint  Patient presents with  . Knee Pain    Left knee pain    HPI  Joshua Hale is a 45 y.o. male who has chronic left knee pain.  I had seen hm in the past for a calcaneus fracture on the left.  He is here today for the left knee.  He had surgery on the left knee in 2000 by Dr. Sallee Provencal in [place].  Prior to that he had had three arthroscopies of the knee on the left by Dr. Amado Coe.  I do not have any of the notes.  The family will obtain them for me.  He had surgery the last time in 2000 which included repositioning the patella tendon distally to help the patella from dislocating.  He had problems with this prior to the surgery.  He has recently started having this problem again.   He says the patella dislocates laterally. It is very painful. He has no direct trauma to do this.  He has more difficulty walking recently because of the recurrent dislocations.  He has no redness, little swelling after it is reduced.  He has medicine.    HPI  Body mass index is 21.79 kg/(m^2).  ROS  Review of Systems  Constitutional:       Patient does not have Diabetes Mellitus. Patient does not have hypertension. Patient has COPD or shortness of breath. Patient does not have BMI > 35. Patient has current smoking history.  HENT: Negative for congestion.   Respiratory: Positive for cough and shortness of breath.   Endocrine: Positive for cold intolerance.  Musculoskeletal: Positive for myalgias, joint swelling, arthralgias and gait problem.  Allergic/Immunologic: Positive for environmental allergies.    Past Medical History  Diagnosis Date  . Post traumatic stress disorder (PTSD)   . Crohn disease [facility])     Past Surgical History  Procedure Laterality Date  . Cholecystectomy    . Knee surgery    . Thumb surgery      Family History  Problem Relation Age of Onset  . Hypertension Mother   . Heart failure Mother    . COPD Mother     Social History Social History  Substance Use Topics  . Smoking status: Current Every Day Smoker -- 1.00 packs/day  . Smokeless tobacco: Never Used  . Alcohol Use: No    Allergies  Allergen Reactions  . Baclofen Swelling  . Fentanyl Hives  . Onion Swelling  . Ultram [Tramadol] Nausea Only    Current Outpatient Prescriptions  Medication Sig Dispense Refill  . diazepam (VALIUM) 10 MG tablet Take 10 mg by mouth 4 (four) times daily. And take 2 tablets at bedtime     . gabapentin (NEURONTIN) 800 MG tablet Take 800 mg by mouth 2 (two) times daily.      Marland Kitchen HYDROcodone-acetaminophen (NORCO/VICODIN) 5-325 MG tablet Take 1 tablet by mouth every 4 (four) hours as needed for moderate pain (Must last 30 days.  Do not take and drive a car or use machinery.). 120 tablet 0  . mirtazapine (REMERON) 30 MG tablet Take 30 mg by mouth at bedtime.      . naproxen sodium (ANAPROX) 220 MG tablet Take 220 mg by mouth 2 (two) times daily with a meal.     No current facility-administered medications for this visit.     Physical Exam  Blood pressure 120/87, pulse 110, temperature 98.6 F (37 C), height 5'  4" (1.626 m), weight 127 lb (57.607 kg).  Constitutional: overall normal hygiene, normal nutrition, well developed, normal grooming, normal body habitus. Assistive device:crutches  Musculoskeletal: gait and station Limp left, muscle tone and strength are normal, no tremors or atrophy is present.  .  Neurological: coordination overall normal.  Deep tendon reflex/nerve stretch intact.  Sensation normal.  Cranial nerves II-XII intact.   Skin:   Many scars around the left knee, old, otherwise overall no scars, lesions, ulcers or rashes. No psoriasis.  Psychiatric: Alert and oriented x 3.  Recent memory intact, remote memory unclear.  Normal mood and affect. Well groomed.  Good eye contact.  Cardiovascular: overall no swelling, no varicosities, no edema bilaterally, normal  temperatures of the legs and arms, no clubbing, cyanosis and good capillary refill.  Lymphatic: palpation is normal.  The left lower extremity is examined:  Inspection:  Thigh:  Non-tender and no defects  Knee does not have swelling 0 effusion.                        Joint tenderness is present                        Patient is tender over the medial joint line  Lower Leg:  Has normal appearance and no tenderness or defects  Ankle:  Non-tender and no defects  Foot:  Non-tender and no defects Range of Motion:  Knee:  Range of motion is: 0-90 with pain.  The knee is diffusely tender to any touch or motion.                        Crepitus is  present  Ankle:  Range of motion is normal. Strength and Tone:  The left lower extremity has normal strength and tone. Stability:  Knee:  The knee is stable.  Ankle:  The ankle is stable.  X-rays were made of the left knee, reported separately.  The patient has been educated about the nature of the problem(s) and counseled on treatment options.  The patient appeared to understand what I have discussed and is in agreement with it.  Encounter Diagnosis  Name Primary?  . Left knee pain Yes    PLAN Call if any problems.  Precautions discussed.  Continue current medications.   Return to clinic after getting MRI of the left knee.  Electronically Signed Sanjuana Kava, MD 6/7/20177:58 PM

## 2015-09-24 ENCOUNTER — Ambulatory Visit (HOSPITAL_COMMUNITY)
Admission: RE | Admit: 2015-09-24 | Discharge: 2015-09-24 | Disposition: A | Discharge status: Home / Self Care | Payer: Medicare Other | Source: Ambulatory Visit | Attending: Orthopaedic Surgery | Admitting: Orthopaedic Surgery

## 2015-09-24 DIAGNOSIS — M25562 Pain in left knee: Secondary | ICD-10-CM

## 2015-09-24 DIAGNOSIS — M1712 Unilateral primary osteoarthritis, left knee: Secondary | ICD-10-CM | POA: Diagnosis not present

## 2015-09-24 DIAGNOSIS — M179 Osteoarthritis of knee, unspecified: Secondary | ICD-10-CM | POA: Diagnosis not present

## 2015-09-24 MED ORDER — LIDOCAINE HCL (PF) 1 % IJ SOLN
INTRAMUSCULAR | Status: AC
  Administered 2015-09-24: 15 mL
  Filled 2015-09-24: qty 15

## 2015-09-24 MED ORDER — IOPAMIDOL (ISOVUE-M 200) INJECTION 41%
40.0000 mL | Freq: Once | INTRAMUSCULAR | Status: AC
  Administered 2015-09-24: 30 mL via INTRA_ARTICULAR

## 2015-09-24 MED ORDER — IOPAMIDOL (ISOVUE-M 200) INJECTION 41%
INTRAMUSCULAR | Status: AC
  Filled 2015-09-24: qty 10

## 2015-09-24 MED ORDER — LIDOCAINE HCL (PF) 1 % IJ SOLN
15.0000 mL | Freq: Once | INTRAMUSCULAR | Status: AC
  Administered 2015-09-24: 15 mL

## 2015-09-24 MED ORDER — IOPAMIDOL (ISOVUE-M 200) INJECTION 41%
INTRAMUSCULAR | Status: AC
  Administered 2015-09-24: 30 mL via INTRA_ARTICULAR
  Filled 2015-09-24: qty 10

## 2015-09-24 NOTE — Procedures (Signed)
Successful fluoroscopic-guided left knee injection for CT arthrogram. 35 mL of diluted Isovue 200 injected. Suprapatellar wrap placed and patient transferred to CT.

## 2015-09-25 ENCOUNTER — Ambulatory Visit (INDEPENDENT_AMBULATORY_CARE_PROVIDER_SITE_OTHER): Payer: Medicare Other | Admitting: Orthopaedic Surgery

## 2015-09-25 ENCOUNTER — Encounter: Payer: Self-pay | Admitting: Orthopaedic Surgery

## 2015-09-25 VITALS — BP 135/86 | HR 86 | Temp 97.9°F | Ht 64.0 in | Wt 127.0 lb

## 2015-09-25 DIAGNOSIS — M25562 Pain in left knee: Secondary | ICD-10-CM

## 2015-09-25 MED ORDER — HYDROCODONE-ACETAMINOPHEN 7.5-325 MG PO TABS: 1.0000 | ORAL_TABLET | ORAL | Status: DC | PRN

## 2015-09-25 NOTE — Patient Instructions (Signed)
Referred to [facility].

## 2015-09-25 NOTE — Progress Notes (Signed)
Patient [id number] Joshua Hale, male DOB:10-Jan-1971, 45 y.o. [id number]  Chief Complaint  Patient presents with  . Results    CT    HPI  Joshua Hale is a 45 y.o. male who has chronic pain of the left knee.  He has had multiple surgical procedures on the left knee, the last one for patella tracking in 2000 at [facility] by Dr. Sallee Provencal.  He has continued pain and feeling of instability of the patella without frank dislocation.  He has swelling at times.  He has no new trauma.  He had CT arthrogram done of his left knee 09-24-15 showing:  IMPRESSION: 1. Osteoarthritis, with prominent chondral thinning in the patellofemoral joint and moderate to prominent chondral thinning in the medial and lateral compartments. 2. No surface tear of the menisci is identified. The cruciate ligaments appear intact along their surfaces. 3. Lag screw fixation of prior tibial tubercle fracture with regional deformity and a chronic ossicle in the distal patellar tendon. 4. Thickened appearance of the distal patellar tendon and distal quadriceps tendon -tendinopathy not excluded.  I have explained the findings to him.  I have recommended continued medicine and no surgery.  He would like to get another opinion at [facility].  I will arrange this for him.    He did bring in rather thick records of his prior surgery at [facility] and I will make copies of these for my records to have them scanned in and also copies for the physicians at [facility].   HPI  Body mass index is 21.79 kg/(m^2).  ROS  Review of Systems  Constitutional:       Patient does not have Diabetes Mellitus. Patient does not have hypertension. Patient has COPD or shortness of breath. Patient does not have BMI > 35. Patient has current smoking history.  HENT: Negative for congestion.   Respiratory: Positive for cough and shortness of breath.   Endocrine: Positive for cold intolerance.  Musculoskeletal: Positive for myalgias, joint swelling, arthralgias and  gait problem.  Allergic/Immunologic: Positive for environmental allergies.    Past Medical History  Diagnosis Date  . Post traumatic stress disorder (PTSD)   . Crohn disease [facility])     Past Surgical History  Procedure Laterality Date  . Cholecystectomy    . Knee surgery    . Thumb surgery      Family History  Problem Relation Age of Onset  . Hypertension Mother   . Heart failure Mother   . COPD Mother     Social History Social History  Substance Use Topics  . Smoking status: Current Every Day Smoker -- 1.00 packs/day  . Smokeless tobacco: Never Used  . Alcohol Use: No    Allergies  Allergen Reactions  . Baclofen Swelling  . Fentanyl Hives  . Onion Swelling  . Ultram [Tramadol] Nausea Only    Current Outpatient Prescriptions  Medication Sig Dispense Refill  . diazepam (VALIUM) 10 MG tablet Take 10 mg by mouth 4 (four) times daily. And take 2 tablets at bedtime     . gabapentin (NEURONTIN) 800 MG tablet Take 800 mg by mouth 2 (two) times daily.      . mirtazapine (REMERON) 30 MG tablet Take 30 mg by mouth at bedtime.      . naproxen sodium (ANAPROX) 220 MG tablet Take 220 mg by mouth 2 (two) times daily with a meal.    . HYDROcodone-acetaminophen (NORCO) 7.5-325 MG tablet Take 1 tablet by mouth every 4 (four) hours as needed  for moderate pain (Must last 15 days.Do not drive or operate machinery while taking this medicine.). 60 tablet 0   No current facility-administered medications for this visit.     Physical Exam  Blood pressure 135/86, pulse 86, temperature 97.9 F (36.6 C), height 5\' 4"  (1.626 m), weight 127 lb (57.607 kg).  Constitutional: overall normal hygiene, normal nutrition, well developed, normal grooming, normal body habitus. Assistive device:none  Musculoskeletal: gait and station Limp left, muscle tone and strength are normal, no tremors or atrophy is present.  .  Neurological: coordination overall normal.  Deep tendon reflex/nerve stretch  intact.  Sensation normal.  Cranial nerves II-XII intact.   Skin:   Significant midline scars of the left knee but otherwise overall no scars, lesions, ulcers or rashes. No psoriasis.  Psychiatric: Alert and oriented x 3.  Recent memory intact, remote memory unclear.  Normal mood and affect. Well groomed.  Good eye contact.  Cardiovascular: overall no swelling, no varicosities, no edema bilaterally, normal temperatures of the legs and arms, no clubbing, cyanosis and good capillary refill.  Lymphatic: palpation is normal.  The left lower extremity is examined:  Inspection:  Thigh:  Non-tender and no defects  Knee does not have swelling 0 effusion.                        Joint tenderness is present                        Patient is tender over the medial joint line  Lower Leg:  Has surgical scars and defect prominence of the anterior tibia post surgery.  Ankle:  Non-tender and no defects  Foot:  Non-tender and no defects Range of Motion:  Knee:  Range of motion is: 0-95                        Crepitus is  present  Ankle:  Range of motion is normal. Strength and Tone:  The left lower extremity has normal strength and tone. Stability:  Knee:  The knee is stable.  Ankle:  The ankle is stable.    The patient has been educated about the nature of the problem(s) and counseled on treatment options.  The patient appeared to understand what I have discussed and is in agreement with it.  Encounter Diagnosis  Name Primary?  . Knee pain, left Yes    PLAN Call if any problems.  Precautions discussed.  Continue current medications.   Return to clinic 3 months   Get opinion at [facility] per patient request.  Electronically Signed Sanjuana Kava, MD 6/13/20177:31 PM

## 2015-10-04 DIAGNOSIS — I10 Essential (primary) hypertension: Secondary | ICD-10-CM | POA: Diagnosis not present

## 2015-10-04 DIAGNOSIS — M545 Low back pain: Secondary | ICD-10-CM | POA: Diagnosis not present

## 2015-10-08 ENCOUNTER — Telehealth: Payer: Self-pay | Admitting: Orthopaedic Surgery

## 2015-10-08 MED ORDER — HYDROCODONE-ACETAMINOPHEN 7.5-325 MG PO TABS: 1.0000 | ORAL_TABLET | ORAL | Status: DC | PRN

## 2015-10-08 NOTE — Telephone Encounter (Signed)
Patient requests refill on: HYDROcodone-acetaminophen (NORCO) 7.5-325 MG tablet [id number]  - quantity 60 tablets.

## 2015-10-08 NOTE — Telephone Encounter (Signed)
Rx done. 

## 2015-10-22 ENCOUNTER — Telehealth: Payer: Self-pay | Admitting: Orthopaedic Surgery

## 2015-10-22 MED ORDER — HYDROCODONE-ACETAMINOPHEN 7.5-325 MG PO TABS: 1.0000 | ORAL_TABLET | ORAL | Status: DC | PRN

## 2015-10-22 NOTE — Telephone Encounter (Signed)
Rx done. 

## 2015-10-22 NOTE — Telephone Encounter (Signed)
Patient called and requested a refill on Hydrocodone-Acetaminophen 7.5-325 mgs.   Qty  60           Sig: Take 1 tablet by mouth every 4 (four) hours as needed for moderate pain (Must last 15 days.Do not take and drive a car or use machinery.).

## 2015-11-06 ENCOUNTER — Telehealth: Payer: Self-pay | Admitting: Orthopaedic Surgery

## 2015-11-06 NOTE — Telephone Encounter (Signed)
Patient requests refill on:  HYDROcodone-acetaminophen (NORCO) 7.5-325 MG tablet 60 tablet 0 10/22/2015    Sig - Route: Take 1 tablet by mouth every 4 (four) hours as needed for moderate pain (Must last 15 days

## 2015-11-07 ENCOUNTER — Encounter: Payer: Self-pay | Admitting: Orthopaedic Surgery

## 2015-11-07 MED ORDER — HYDROCODONE-ACETAMINOPHEN 7.5-325 MG PO TABS: 1.0000 | ORAL_TABLET | ORAL | 0 refills | Status: DC | PRN

## 2015-11-21 ENCOUNTER — Telehealth: Payer: Self-pay | Admitting: Orthopaedic Surgery

## 2015-11-21 NOTE — Telephone Encounter (Signed)
Patient requests refill on Hydrocodone/Acetaminophen 7.5-325 mgs.  Qty 60  Sig: Take 1 tablet by mouth every 4 (four) hours as needed for moderate pain (Must last 15 days.Do not drive or operate machinery while taking this medicine.).

## 2015-11-22 MED ORDER — HYDROCODONE-ACETAMINOPHEN 5-325 MG PO TABS: 1.0000 | ORAL_TABLET | ORAL | 0 refills | Status: DC | PRN

## 2015-12-04 DIAGNOSIS — I10 Essential (primary) hypertension: Secondary | ICD-10-CM | POA: Diagnosis not present

## 2015-12-04 DIAGNOSIS — M545 Low back pain: Secondary | ICD-10-CM | POA: Diagnosis not present

## 2015-12-05 ENCOUNTER — Telehealth: Payer: Self-pay | Admitting: Orthopaedic Surgery

## 2015-12-05 MED ORDER — HYDROCODONE-ACETAMINOPHEN 5-325 MG PO TABS: 1.0000 | ORAL_TABLET | ORAL | 0 refills | Status: DC | PRN

## 2015-12-05 NOTE — Telephone Encounter (Signed)
Done

## 2015-12-05 NOTE — Telephone Encounter (Signed)
Patient called for refill:  °HYDROcodone-acetaminophen (NORCO/VICODIN) 5-325 MG tablet 56 tablet  ° ° °

## 2015-12-20 MED ORDER — HYDROCODONE-ACETAMINOPHEN 5-325 MG PO TABS: 1.0000 | ORAL_TABLET | ORAL | 0 refills | Status: DC | PRN

## 2015-12-21 ENCOUNTER — Encounter: Payer: Self-pay | Admitting: Orthopaedic Surgery

## 2015-12-26 ENCOUNTER — Encounter: Payer: Self-pay | Admitting: Orthopaedic Surgery

## 2015-12-26 ENCOUNTER — Ambulatory Visit (INDEPENDENT_AMBULATORY_CARE_PROVIDER_SITE_OTHER): Payer: Medicare Other | Admitting: Orthopaedic Surgery

## 2015-12-26 VITALS — BP 113/81 | HR 75 | Temp 97.9°F | Ht 65.0 in | Wt 123.0 lb

## 2015-12-26 DIAGNOSIS — M25562 Pain in left knee: Secondary | ICD-10-CM | POA: Diagnosis not present

## 2015-12-26 NOTE — Progress Notes (Signed)
Patient Joshua Hale:[id number] [name], male DOB:07-Oct-1970, 45 y.o. [id number]  Chief Complaint  Patient presents with  . Follow-up    left knee pain    HPI  Joshua Hale is a 45 y.o. male who has chronic left knee pain.  He has no new trauma, no giving way, no locking.  He has swelling.  He went to [facility] for his second opinion and they agreed that he should not have any other surgery on the knee unless absolutely necessary.  He will keep on taking his medicine and doing his exercises. HPI  Body mass index is 20.47 kg/m.  ROS  Review of Systems  Constitutional:       Patient does not have Diabetes Mellitus. Patient does not have hypertension. Patient has COPD or shortness of breath. Patient does not have BMI > 35. Patient has current smoking history.  HENT: Negative for congestion.   Respiratory: Positive for cough and shortness of breath.   Endocrine: Positive for cold intolerance.  Musculoskeletal: Positive for arthralgias, gait problem, joint swelling and myalgias.  Allergic/Immunologic: Positive for environmental allergies.    Past Medical History:  Diagnosis Date  . Crohn disease ([place])   . Post traumatic stress disorder (PTSD)     Past Surgical History:  Procedure Laterality Date  . CHOLECYSTECTOMY    . KNEE SURGERY    . thumb surgery      Family History  Problem Relation Age of Onset  . Hypertension Mother   . Heart failure Mother   . COPD Mother     Social History Social History  Substance Use Topics  . Smoking status: Current Every Day Smoker    Packs/day: 1.00  . Smokeless tobacco: Never Used  . Alcohol use No    Allergies  Allergen Reactions  . Baclofen Swelling  . Fentanyl Hives  . Onion Swelling  . Ultram [Tramadol] Nausea Only    Current Outpatient Prescriptions  Medication Sig Dispense Refill  . diazepam (VALIUM) 10 MG tablet Take 10 mg by mouth 4 (four) times daily. And take 2 tablets at bedtime     . gabapentin (NEURONTIN) 800 MG tablet Take  800 mg by mouth 2 (two) times daily.      Marland Kitchen HYDROcodone-acetaminophen (NORCO/VICODIN) 5-325 MG tablet Take 1 tablet by mouth every 4 (four) hours as needed for moderate pain (Must last 14 days.Do not take and drive a car or use machinery.). 50 tablet 0  . mirtazapine (REMERON) 30 MG tablet Take 30 mg by mouth at bedtime.      . naproxen sodium (ANAPROX) 220 MG tablet Take 220 mg by mouth 2 (two) times daily with a meal.     No current facility-administered medications for this visit.      Physical Exam  Blood pressure 113/81, pulse 75, temperature 97.9 F (36.6 C), height 5\' 5"  (1.651 m), weight 123 lb (55.8 kg).  Constitutional: overall normal hygiene, normal nutrition, well developed, normal grooming, normal body habitus. Assistive device:none  Musculoskeletal: gait and station Limp left, muscle tone and strength are normal, no tremors or atrophy is present.  .  Neurological: coordination overall normal.  Deep tendon reflex/nerve stretch intact.  Sensation normal.  Cranial nerves II-XII intact.   Skin:   Normal overall with left knee multiple scars, no lesions, ulcers or rashes. No psoriasis.  Psychiatric: Alert and oriented x 3.  Recent memory intact, remote memory unclear.  Normal mood and affect. Well groomed.  Good eye contact.  Cardiovascular: overall no  swelling, no varicosities, no edema bilaterally, normal temperatures of the legs and arms, no clubbing, cyanosis and good capillary refill.  Lymphatic: palpation is normal.  The left lower extremity is examined:  Inspection:  Thigh:  Non-tender and no defects  Knee has swelling 1+ effusion.                        Joint tenderness is present                        Patient is tender over the medial joint line  Lower Leg:  Has normal appearance and no tenderness or defects  Ankle:  Non-tender and no defects  Foot:  Non-tender and no defects Range of Motion:  Knee:  Range of motion is: 0-100                         Crepitus is  present  Ankle:  Range of motion is normal. Strength and Tone:  The left lower extremity has normal strength and tone. Stability:  Knee:  The knee is stable.  Ankle:  The ankle is stable.    The patient has been educated about the nature of the problem(s) and counseled on treatment options.  The patient appeared to understand what I have discussed and is in agreement with it.  Encounter Diagnosis  Name Primary?  . Knee pain, left Yes    PLAN Call if any problems.  Precautions discussed.  Continue current medications.   Return to clinic 3 months   Electronically Signed Sanjuana Kava, MD 9/13/20172:12 PM

## 2015-12-26 NOTE — Patient Instructions (Signed)
Bupropion sustained-release tablets (smoking cessation) What is this medicine? BUPROPION (byoo PROE pee on) is used to help people quit smoking. This medicine may be used for other purposes; ask your health care provider or pharmacist if you have questions. What should I tell my health care provider before I take this medicine? They need to know if you have any of these conditions: -an eating disorder, such as anorexia or bulimia -bipolar disorder or psychosis -diabetes or high blood sugar, treated with medication -glaucoma -head injury or brain tumor -heart disease, previous heart attack, or irregular heart beat -high blood pressure -kidney or liver disease -seizures -suicidal thoughts or a previous suicide attempt -Tourette's syndrome -weight loss -an unusual or allergic reaction to bupropion, other medicines, foods, dyes, or preservatives -breast-feeding -pregnant or trying to become pregnant How should I use this medicine? Take this medicine by mouth with a glass of water. Follow the directions on the prescription label. You can take it with or without food. If it upsets your stomach, take it with food. Do not cut, crush or chew this medicine. Take your medicine at regular intervals. If you take this medicine more than once a day, take your second dose at least 8 hours after you take your first dose. To limit difficulty in sleeping, avoid taking this medicine at bedtime. Do not take your medicine more often than directed. Do not stop taking this medicine suddenly except upon the advice of your doctor. Stopping this medicine too quickly may cause serious side effects. A special MedGuide will be given to you by the pharmacist with each prescription and refill. Be sure to read this information carefully each time. Talk to your pediatrician regarding the use of this medicine in children. Special care may be needed. Overdosage: If you think you have taken too much of this medicine contact a  poison control center or emergency room at once. NOTE: This medicine is only for you. Do not share this medicine with others. What if I miss a dose? If you miss a dose, skip the missed dose and take your next tablet at the regular time. There should be at least 8 hours between doses. Do not take double or extra doses. What may interact with this medicine? Do not take this medicine with any of the following medications: -linezolid -MAOIs like Azilect, Carbex, Eldepryl, Marplan, Nardil, and Parnate -methylene blue (injected into a vein) -other medicines that contain bupropion like Wellbutrin This medicine may also interact with the following medications: -alcohol -certain medicines for anxiety or sleep -certain medicines for blood pressure like metoprolol, propranolol -certain medicines for depression or psychotic disturbances -certain medicines for HIV or AIDS like efavirenz, lopinavir, nelfinavir, ritonavir -certain medicines for irregular heart beat like propafenone, flecainide -certain medicines for Parkinson's disease like amantadine, levodopa -certain medicines for seizures like carbamazepine, phenytoin, phenobarbital -cimetidine -clopidogrel -cyclophosphamide -furazolidone -isoniazid -nicotine -orphenadrine -procarbazine -steroid medicines like prednisone or cortisone -stimulant medicines for attention disorders, weight loss, or to stay awake -tamoxifen -theophylline -thiotepa -ticlopidine -tramadol -warfarin This list may not describe all possible interactions. Give your health care provider a list of all the medicines, herbs, non-prescription drugs, or dietary supplements you use. Also tell them if you smoke, drink alcohol, or use illegal drugs. Some items may interact with your medicine. What should I watch for while using this medicine? Visit your doctor or health care professional for regular checks on your progress. This medicine should be used together with a patient  support program. It is important   to participate in a behavioral program, counseling, or other support program that is recommended by your health care professional. Patients and their families should watch out for new or worsening thoughts of suicide or depression. Also watch out for sudden changes in feelings such as feeling anxious, agitated, panicky, irritable, hostile, aggressive, impulsive, severely restless, overly excited and hyperactive, or not being able to sleep. If this happens, especially at the beginning of treatment or after a change in dose, call your health care professional. Avoid alcoholic drinks while taking this medicine. Drinking excessive alcoholic beverages, using sleeping or anxiety medicines, or quickly stopping the use of these agents while taking this medicine may increase your risk for a seizure. Do not drive or use heavy machinery until you know how this medicine affects you. This medicine can impair your ability to perform these tasks. Do not take this medicine close to bedtime. It may prevent you from sleeping. Your mouth may get dry. Chewing sugarless gum or sucking hard candy, and drinking plenty of water may help. Contact your doctor if the problem does not go away or is severe. Do not use nicotine patches or chewing gum without the advice of your doctor or health care professional while taking this medicine. You may need to have your blood pressure taken regularly if your doctor recommends that you use both nicotine and this medicine together. What side effects may I notice from receiving this medicine? Side effects that you should report to your doctor or health care professional as soon as possible: -allergic reactions like skin rash, itching or hives, swelling of the face, lips, or tongue -breathing problems -changes in vision -confusion -fast or irregular heartbeat -hallucinations -increased blood pressure -redness, blistering, peeling or loosening of the skin,  including inside the mouth -seizures -suicidal thoughts or other mood changes -unusually weak or tired -vomiting Side effects that usually do not require medical attention (report to your doctor or health care professional if they continue or are bothersome): -change in sex drive or performance -constipation -headache -loss of appetite -nausea -tremors -weight loss This list may not describe all possible side effects. Call your doctor for medical advice about side effects. You may report side effects to FDA at 1-800-FDA-1088. Where should I keep my medicine? Keep out of the reach of children. Store at room temperature between 20 and 25 degrees C (68 and 77 degrees F). Protect from light. Keep container tightly closed. Throw away any unused medicine after the expiration date. NOTE: This sheet is a summary. It may not cover all possible information. If you have questions about this medicine, talk to your doctor, pharmacist, or health care provider.    2016, Elsevier/Gold Standard. (2012-11-26 10:55:10)  

## 2016-01-02 ENCOUNTER — Other Ambulatory Visit: Payer: Self-pay | Admitting: Orthopaedic Surgery

## 2016-01-02 ENCOUNTER — Telehealth: Payer: Self-pay | Admitting: Orthopaedic Surgery

## 2016-01-02 MED ORDER — HYDROCODONE-ACETAMINOPHEN 5-325 MG PO TABS: 1.0000 | ORAL_TABLET | Freq: Four times a day (QID) | ORAL | 0 refills | Status: DC | PRN

## 2016-01-16 ENCOUNTER — Telehealth: Payer: Self-pay | Admitting: Orthopaedic Surgery

## 2016-01-16 NOTE — Telephone Encounter (Signed)
Hydrocodone-Acetaminophen 5/325mg   Qty 45 Tablets  Take 1 tablet by mouth every 6 (six) hours as needed for moderate pain (Must last 14 days. Do not take and drive a car or use machinery.).

## 2016-01-16 NOTE — Telephone Encounter (Signed)
yes

## 2016-01-16 NOTE — Telephone Encounter (Signed)
Routing to Dr. Harrison to advise 

## 2016-01-21 ENCOUNTER — Other Ambulatory Visit: Payer: Self-pay | Admitting: *Deleted

## 2016-01-21 MED ORDER — HYDROCODONE-ACETAMINOPHEN 5-325 MG PO TABS: 1.0000 | ORAL_TABLET | Freq: Four times a day (QID) | ORAL | 0 refills | Status: DC | PRN

## 2016-01-30 ENCOUNTER — Other Ambulatory Visit: Payer: Self-pay | Admitting: Orthopedic Surgery

## 2016-01-30 MED ORDER — HYDROCODONE-ACETAMINOPHEN 5-325 MG PO TABS: 1.0000 | ORAL_TABLET | Freq: Four times a day (QID) | ORAL | 0 refills | Status: DC | PRN

## 2016-02-04 DIAGNOSIS — M545 Low back pain: Secondary | ICD-10-CM | POA: Diagnosis not present

## 2016-02-04 DIAGNOSIS — I10 Essential (primary) hypertension: Secondary | ICD-10-CM | POA: Diagnosis not present

## 2016-02-13 ENCOUNTER — Telehealth: Payer: Self-pay | Admitting: Orthopaedic Surgery

## 2016-02-18 MED ORDER — HYDROCODONE-ACETAMINOPHEN 5-325 MG PO TABS: 1.0000 | ORAL_TABLET | Freq: Four times a day (QID) | ORAL | 0 refills | Status: DC | PRN

## 2016-02-28 ENCOUNTER — Telehealth: Payer: Self-pay | Admitting: Orthopaedic Surgery

## 2016-03-04 MED ORDER — HYDROCODONE-ACETAMINOPHEN 5-325 MG PO TABS: 1.0000 | ORAL_TABLET | Freq: Four times a day (QID) | ORAL | 0 refills | Status: DC | PRN

## 2016-03-04 NOTE — Telephone Encounter (Signed)
Hydrocodone-Acetaminophen 5/325mg  Qty 30 Tablets   Patient sent this by MyChart, please check

## 2016-03-05 ENCOUNTER — Encounter: Payer: Self-pay | Admitting: Orthopaedic Surgery

## 2016-03-11 ENCOUNTER — Encounter: Payer: Self-pay | Admitting: Orthopaedic Surgery

## 2016-03-18 ENCOUNTER — Telehealth: Payer: Self-pay | Admitting: Orthopaedic Surgery

## 2016-03-18 MED ORDER — HYDROCODONE-ACETAMINOPHEN 5-325 MG PO TABS: 1.0000 | ORAL_TABLET | Freq: Four times a day (QID) | ORAL | 0 refills | Status: DC | PRN

## 2016-03-26 ENCOUNTER — Ambulatory Visit (INDEPENDENT_AMBULATORY_CARE_PROVIDER_SITE_OTHER): Payer: Medicare Other | Admitting: Orthopaedic Surgery

## 2016-03-26 ENCOUNTER — Encounter: Payer: Self-pay | Admitting: Orthopaedic Surgery

## 2016-03-26 VITALS — BP 125/88 | HR 107 | Temp 98.8°F | Ht 65.0 in | Wt 123.0 lb

## 2016-03-26 DIAGNOSIS — F17210 Nicotine dependence, cigarettes, uncomplicated: Secondary | ICD-10-CM | POA: Diagnosis not present

## 2016-03-26 DIAGNOSIS — M25562 Pain in left knee: Secondary | ICD-10-CM

## 2016-03-26 DIAGNOSIS — G8929 Other chronic pain: Secondary | ICD-10-CM | POA: Diagnosis not present

## 2016-03-26 MED ORDER — HYDROCODONE-ACETAMINOPHEN 5-325 MG PO TABS: 1.0000 | ORAL_TABLET | Freq: Four times a day (QID) | ORAL | 0 refills | Status: DC | PRN

## 2016-03-26 NOTE — Progress Notes (Signed)
Patient Joshua Hale Joshua Hale, male DOB:[birth year], 45 y.o. Joshua Hale  Chief Complaint  Patient presents with  . Follow-up    left knee pain    HPI  Joshua Hale is a 45 y.o. male who has chronic left knee pain.  He has had multiple surgeries on the knee.  He has no giving way.  He has popping and swelling.  He has no new trauma. HPI  Body mass index is 20.47 kg/m.  ROS  Review of Systems  Constitutional:       Patient does not have Diabetes Mellitus. Patient does not have hypertension. Patient has COPD or shortness of breath. Patient does not have BMI > 35. Patient has current smoking history.  HENT: Negative for congestion.   Respiratory: Positive for cough and shortness of breath.   Endocrine: Positive for cold intolerance.  Musculoskeletal: Positive for arthralgias, gait problem, joint swelling and myalgias.  Allergic/Immunologic: Positive for environmental allergies.    Past Medical History:  Diagnosis Date  . Crohn disease ([place])   . Post traumatic stress disorder (PTSD)     Past Surgical History:  Procedure Laterality Date  . CHOLECYSTECTOMY    . KNEE SURGERY    . thumb surgery      Family History  Problem Relation Age of Onset  . Hypertension Mother   . Heart failure Mother   . COPD Mother     Social History Social History  Substance Use Topics  . Smoking status: Current Every Day Smoker    Packs/day: 1.00  . Smokeless tobacco: Never Used  . Alcohol use No    Allergies  Allergen Reactions  . Baclofen Swelling  . Fentanyl Hives  . Onion Swelling  . Ultram [Tramadol] Nausea Only    Current Outpatient Prescriptions  Medication Sig Dispense Refill  . diazepam (VALIUM) 10 MG tablet Take 10 mg by mouth 4 (four) times daily. And take 2 tablets at bedtime     . gabapentin (NEURONTIN) 800 MG tablet Take 800 mg by mouth 2 (two) times daily.      Marland Kitchen HYDROcodone-acetaminophen (NORCO/VICODIN) 5-325 MG tablet Take 1 tablet by mouth every 6 (six) hours as  needed for moderate pain (Must last 14 days.Do not take and drive a car or use machinery.). 50 tablet 0  . mirtazapine (REMERON) 30 MG tablet Take 30 mg by mouth at bedtime.      . naproxen sodium (ANAPROX) 220 MG tablet Take 220 mg by mouth 2 (two) times daily with a meal.     No current facility-administered medications for this visit.      Physical Exam  Blood pressure 125/88, pulse (!) 107, temperature 98.8 F (37.1 C), height 5\' 5"  (1.651 m), weight 123 lb (55.8 kg).  Constitutional: overall normal hygiene, normal nutrition, well developed, normal grooming, normal body habitus. Assistive device:none  Musculoskeletal: gait and station Limp left, muscle tone and strength are normal, no tremors or atrophy is present.  .  Neurological: coordination overall normal.  Deep tendon reflex/nerve stretch intact.  Sensation normal.  Cranial nerves II-XII intact.   Skin:   Normal overall has scars of left knee multiple, otherwise no lesions, ulcers or rashes. No psoriasis.  Psychiatric: Alert and oriented x 3.  Recent memory intact, remote memory unclear.  Normal mood and affect. Well groomed.  Good eye contact.  Cardiovascular: overall no swelling, no varicosities, no edema bilaterally, normal temperatures of the legs and arms, no clubbing, cyanosis and good capillary refill.  Lymphatic: palpation  is normal.  The left lower extremity is examined:  Inspection:  Thigh:  Non-tender and no defects  Knee does not have swelling 0 effusion.                        Joint tenderness is present                        Patient is tender over the medial joint line  Lower Leg:  Has normal appearance and no tenderness or defects  Ankle:  Non-tender and no defects  Foot:  Non-tender and no defects Range of Motion:  Knee:  Range of motion is: 0-100                        Crepitus is  present  Ankle:  Range of motion is normal. Strength and Tone:  The left lower extremity has normal strength and  tone. Stability:  Knee:  The knee is stable.  Ankle:  The ankle is stable.    The patient has been educated about the nature of the problem(s) and counseled on treatment options.  The patient appeared to understand what I have discussed and is in agreement with it.  Encounter Diagnoses  Name Primary?  . Chronic pain of left knee Yes  . Cigarette nicotine dependence without complication     PLAN Call if any problems.  Precautions discussed.  Continue current medications.   Return to clinic 3 months   Electronically Signed Sanjuana Kava, MD 12/13/20171:54 PM

## 2016-03-26 NOTE — Patient Instructions (Signed)
Steps to Quit Smoking Smoking tobacco can be bad for your health. It can also affect almost every organ in your body. Smoking puts you and people around you at risk for many serious long-lasting (chronic) diseases. Quitting smoking is hard, but it is one of the best things that you can do for your health. It is never too late to quit. What are the benefits of quitting smoking? When you quit smoking, you lower your risk for getting serious diseases and conditions. They can include:  Lung cancer or lung disease.  Heart disease.  Stroke.  Heart attack.  Not being able to have children (infertility).  Weak bones (osteoporosis) and broken bones (fractures). If you have coughing, wheezing, and shortness of breath, those symptoms may get better when you quit. You may also get sick less often. If you are pregnant, quitting smoking can help to lower your chances of having a baby of low birth weight. What can I do to help me quit smoking? Talk with your doctor about what can help you quit smoking. Some things you can do (strategies) include:  Quitting smoking totally, instead of slowly cutting back how much you smoke over a period of time.  Going to in-person counseling. You are more likely to quit if you go to many counseling sessions.  Using resources and support systems, such as:  Online chats with a counselor.  Phone quitlines.  Printed self-help materials.  Support groups or group counseling.  Text messaging programs.  Mobile phone apps or applications.  Taking medicines. Some of these medicines may have nicotine in them. If you are pregnant or breastfeeding, do not take any medicines to quit smoking unless your doctor says it is okay. Talk with your doctor about counseling or other things that can help you. Talk with your doctor about using more than one strategy at the same time, such as taking medicines while you are also going to in-person counseling. This can help make quitting  easier. What things can I do to make it easier to quit? Quitting smoking might feel very hard at first, but there is a lot that you can do to make it easier. Take these steps:  Talk to your family and friends. Ask them to support and encourage you.  Call phone quitlines, reach out to support groups, or work with a counselor.  Ask people who smoke to not smoke around you.  Avoid places that make you want (trigger) to smoke, such as:  Bars.  Parties.  Smoke-break areas at work.  Spend time with people who do not smoke.  Lower the stress in your life. Stress can make you want to smoke. Try these things to help your stress:  Getting regular exercise.  Deep-breathing exercises.  Yoga.  Meditating.  Doing a body scan. To do this, close your eyes, focus on one area of your body at a time from head to toe, and notice which parts of your body are tense. Try to relax the muscles in those areas.  Download or buy apps on your mobile phone or tablet that can help you stick to your quit plan. There are many free apps, such as QuitGuide from the CDC (Centers for Disease Control and Prevention). You can find more support from smokefree.gov and other websites. This information is not intended to replace advice given to you by your health care provider. Make sure you discuss any questions you have with your health care provider. Document Released: 01/25/2009 Document Revised: 11/27/2015 Document   Reviewed: 08/15/2014 Elsevier Interactive Patient Education  2017 Elsevier Inc.  

## 2016-04-01 ENCOUNTER — Telehealth: Payer: Self-pay | Admitting: Orthopaedic Surgery

## 2016-04-01 NOTE — Telephone Encounter (Signed)
Hydrocodone-Acetaminophen  5/325mg  Qty 50 Tablets °

## 2016-04-02 MED ORDER — HYDROCODONE-ACETAMINOPHEN 5-325 MG PO TABS: 1.0000 | ORAL_TABLET | Freq: Four times a day (QID) | ORAL | 0 refills | Status: DC | PRN

## 2016-04-03 DIAGNOSIS — I10 Essential (primary) hypertension: Secondary | ICD-10-CM | POA: Diagnosis not present

## 2016-04-03 DIAGNOSIS — M545 Low back pain: Secondary | ICD-10-CM | POA: Diagnosis not present

## 2016-04-15 ENCOUNTER — Telehealth: Payer: Self-pay | Admitting: Orthopaedic Surgery

## 2016-04-15 MED ORDER — HYDROCODONE-ACETAMINOPHEN 5-325 MG PO TABS: 1.0000 | ORAL_TABLET | Freq: Four times a day (QID) | ORAL | 0 refills | Status: DC | PRN

## 2016-04-28 ENCOUNTER — Other Ambulatory Visit: Payer: Self-pay | Admitting: Orthopaedic Surgery

## 2016-04-29 MED ORDER — HYDROCODONE-ACETAMINOPHEN 5-325 MG PO TABS: 1.0000 | ORAL_TABLET | Freq: Four times a day (QID) | ORAL | 0 refills | Status: DC | PRN

## 2016-05-05 ENCOUNTER — Encounter: Payer: Self-pay | Admitting: Orthopaedic Surgery

## 2016-05-13 ENCOUNTER — Telehealth: Payer: Self-pay | Admitting: Orthopaedic Surgery

## 2016-05-13 MED ORDER — HYDROCODONE-ACETAMINOPHEN 5-325 MG PO TABS: 1.0000 | ORAL_TABLET | Freq: Four times a day (QID) | ORAL | 0 refills | Status: DC | PRN

## 2016-05-26 ENCOUNTER — Other Ambulatory Visit: Payer: Self-pay | Admitting: Orthopaedic Surgery

## 2016-05-27 NOTE — Telephone Encounter (Signed)
Patient called to follow up. Relayed Dr Brooke Bonito response.  Patient voiced understanding.

## 2016-06-03 DIAGNOSIS — I10 Essential (primary) hypertension: Secondary | ICD-10-CM | POA: Diagnosis not present

## 2016-06-03 DIAGNOSIS — M545 Low back pain: Secondary | ICD-10-CM | POA: Diagnosis not present

## 2016-06-25 ENCOUNTER — Ambulatory Visit (INDEPENDENT_AMBULATORY_CARE_PROVIDER_SITE_OTHER): Payer: Medicare Other | Admitting: Orthopaedic Surgery

## 2016-06-25 ENCOUNTER — Encounter: Payer: Self-pay | Admitting: Orthopaedic Surgery

## 2016-06-25 VITALS — BP 122/85 | HR 95 | Temp 98.2°F | Ht 65.0 in | Wt 124.0 lb

## 2016-06-25 DIAGNOSIS — G8929 Other chronic pain: Secondary | ICD-10-CM | POA: Diagnosis not present

## 2016-06-25 DIAGNOSIS — G894 Chronic pain syndrome: Secondary | ICD-10-CM

## 2016-06-25 DIAGNOSIS — F17210 Nicotine dependence, cigarettes, uncomplicated: Secondary | ICD-10-CM | POA: Diagnosis not present

## 2016-06-25 DIAGNOSIS — M25562 Pain in left knee: Secondary | ICD-10-CM | POA: Diagnosis not present

## 2016-06-25 MED ORDER — HYDROCODONE-ACETAMINOPHEN 5-325 MG PO TABS: 1.0000 | ORAL_TABLET | Freq: Four times a day (QID) | ORAL | 0 refills | Status: DC | PRN

## 2016-06-25 NOTE — Progress Notes (Signed)
Patient Joshua Hale, male DOB:[birth year], 46 y.o. POE:[id number]  Chief Complaint  Patient presents with  . Follow-up    CHRONIC LEFT KNEE PAIN    HPI  ESTIL VALLEE is a 46 y.o. male who has chronic pain of the left knee.  He has no new trauma.  He has had multiple surgeries of the left knee in the past.  He has no giving way or redness.  He has some swelling at times. HPI  Body mass index is 20.63 kg/m.  ROS  Review of Systems  Constitutional:       Patient does not have Diabetes Mellitus. Patient does not have hypertension. Patient has COPD or shortness of breath. Patient does not have BMI > 35. Patient has current smoking history.  HENT: Negative for congestion.   Respiratory: Positive for cough and shortness of breath.   Endocrine: Positive for cold intolerance.  Musculoskeletal: Positive for arthralgias, gait problem, joint swelling and myalgias.  Allergic/Immunologic: Positive for environmental allergies.    Past Medical History:  Diagnosis Date  . Crohn disease ([place])   . Post traumatic stress disorder (PTSD)     Past Surgical History:  Procedure Laterality Date  . CHOLECYSTECTOMY    . KNEE SURGERY    . thumb surgery      Family History  Problem Relation Age of Onset  . Hypertension Mother   . Heart failure Mother   . COPD Mother     Social History Social History  Substance Use Topics  . Smoking status: Current Every Day Smoker    Packs/day: 1.00  . Smokeless tobacco: Never Used  . Alcohol use No    Allergies  Allergen Reactions  . Baclofen Swelling  . Fentanyl Hives  . Onion Swelling  . Ultram [Tramadol] Nausea Only    Current Outpatient Prescriptions  Medication Sig Dispense Refill  . diazepam (VALIUM) 10 MG tablet Take 10 mg by mouth 4 (four) times daily. And take 2 tablets at bedtime     . gabapentin (NEURONTIN) 800 MG tablet Take 800 mg by mouth 2 (two) times daily.      Marland Kitchen HYDROcodone-acetaminophen (NORCO/VICODIN) 5-325 MG tablet  Take 1 tablet by mouth every 6 (six) hours as needed for moderate pain (Must last 14 days.Do not take and drive a car or use machinery.). 40 tablet 0  . mirtazapine (REMERON) 30 MG tablet Take 30 mg by mouth at bedtime.      . naproxen sodium (ANAPROX) 220 MG tablet Take 220 mg by mouth 2 (two) times daily with a meal.     No current facility-administered medications for this visit.      Physical Exam  Blood pressure 122/85, pulse 95, temperature 98.2 F (36.8 C), height 5\' 5"  (1.651 m), weight 124 lb (56.2 kg).  Constitutional: overall normal hygiene, normal nutrition, well developed, normal grooming, normal body habitus. Assistive device:none  Musculoskeletal: gait and station Limp left, muscle tone and strength are normal, no tremors or atrophy is present.  .  Neurological: coordination overall normal.  Deep tendon reflex/nerve stretch intact.  Sensation normal.  Cranial nerves II-XII intact.   Skin:   Normal overall multiple left knee scars, no lesions, ulcers or rashes. No psoriasis.  Psychiatric: Alert and oriented x 3.  Recent memory intact, remote memory unclear.  Normal mood and affect. Well groomed.  Good eye contact.  Cardiovascular: overall no swelling, no varicosities, no edema bilaterally, normal temperatures of the legs and arms, no clubbing, cyanosis and  good capillary refill.  Lymphatic: palpation is normal.  The left lower extremity is examined:  Inspection:  Thigh:  Non-tender and no defects  Knee does not have swelling 0 effusion.                        Joint tenderness is present                        Patient is tender over the medial joint line  Lower Leg:  Has normal appearance and no tenderness or defects  Ankle:  Non-tender and no defects  Foot:  Non-tender and no defects Range of Motion:  Knee:  Range of motion is: 0-100                        Crepitus is  present  Ankle:  Range of motion is normal. Strength and Tone:  The left lower extremity  has normal strength and tone. Stability:  Knee:  The knee is stable.  Ankle:  The ankle is stable.    The patient has been educated about the nature of the problem(s) and counseled on treatment options.  The patient appeared to understand what I have discussed and is in agreement with it.  Encounter Diagnoses  Name Primary?  . Chronic pain of left knee Yes  . Cigarette nicotine dependence without complication   . Chronic pain syndrome     PLAN Call if any problems.  Precautions discussed.  Continue current medications.   Return to clinic 3 months   I have reviewed the [place] web site prior to prescribing narcotic medicine for this patient.  Electronically Signed Sanjuana Kava, MD 3/14/20182:14 PM

## 2016-07-07 ENCOUNTER — Telehealth: Payer: Self-pay | Admitting: Orthopaedic Surgery

## 2016-07-07 MED ORDER — HYDROCODONE-ACETAMINOPHEN 5-325 MG PO TABS: 1.0000 | ORAL_TABLET | Freq: Four times a day (QID) | ORAL | 0 refills | Status: DC | PRN

## 2016-07-13 ENCOUNTER — Encounter (HOSPITAL_COMMUNITY): Payer: Self-pay | Admitting: *Deleted

## 2016-07-13 ENCOUNTER — Emergency Department (HOSPITAL_COMMUNITY)
Admission: EM | Admit: 2016-07-13 | Discharge: 2016-07-13 | Disposition: A | Discharge status: Home / Self Care | Payer: Medicare Other | Attending: Emergency Medicine | Admitting: Emergency Medicine

## 2016-07-13 ENCOUNTER — Emergency Department (HOSPITAL_COMMUNITY): Payer: Medicare Other

## 2016-07-13 DIAGNOSIS — Y92009 Unspecified place in unspecified non-institutional (private) residence as the place of occurrence of the external cause: Secondary | ICD-10-CM | POA: Diagnosis not present

## 2016-07-13 DIAGNOSIS — M25572 Pain in left ankle and joints of left foot: Secondary | ICD-10-CM | POA: Diagnosis not present

## 2016-07-13 DIAGNOSIS — Y9389 Activity, other specified: Secondary | ICD-10-CM | POA: Diagnosis not present

## 2016-07-13 DIAGNOSIS — M25562 Pain in left knee: Secondary | ICD-10-CM | POA: Diagnosis not present

## 2016-07-13 DIAGNOSIS — F17200 Nicotine dependence, unspecified, uncomplicated: Secondary | ICD-10-CM | POA: Diagnosis not present

## 2016-07-13 DIAGNOSIS — Z79899 Other long term (current) drug therapy: Secondary | ICD-10-CM | POA: Insufficient documentation

## 2016-07-13 DIAGNOSIS — S93602A Unspecified sprain of left foot, initial encounter: Secondary | ICD-10-CM | POA: Diagnosis not present

## 2016-07-13 DIAGNOSIS — X501XXA Overexertion from prolonged static or awkward postures, initial encounter: Secondary | ICD-10-CM | POA: Insufficient documentation

## 2016-07-13 DIAGNOSIS — Y999 Unspecified external cause status: Secondary | ICD-10-CM | POA: Diagnosis not present

## 2016-07-13 DIAGNOSIS — S93402A Sprain of unspecified ligament of left ankle, initial encounter: Secondary | ICD-10-CM | POA: Diagnosis not present

## 2016-07-13 DIAGNOSIS — S99912A Unspecified injury of left ankle, initial encounter: Secondary | ICD-10-CM | POA: Diagnosis present

## 2016-07-13 MED ORDER — DICLOFENAC SODIUM 50 MG PO TBEC: 50.0000 mg | DELAYED_RELEASE_TABLET | Freq: Two times a day (BID) | ORAL | 0 refills | Status: DC

## 2016-07-13 NOTE — ED Triage Notes (Signed)
Pt states his knee gave out on him Friday. Pt now c/o left knee and left ankle pain.

## 2016-07-13 NOTE — ED Provider Notes (Signed)
[facility] DEPT Provider Note   CSN: [id number] Arrival date & time: 07/13/16  1845  By signing my name below, I, Joshua Hale, attest that this documentation has been prepared under the direction and in the presence of non-physician practitioner, Debroah Baller, NP. Electronically Signed: Dolores Hale, Scribe. 07/13/2016. 7:26 PM.  History   Chief Complaint Chief Complaint  Patient presents with  . Ankle Injury   The history is provided by the patient. No language interpreter was used.  Ankle Pain   The incident occurred more than 2 days ago. The incident occurred at home. The injury mechanism was a fall. The pain is present in the left ankle. The pain is moderate. The pain has been constant since onset. Pertinent negatives include no numbness. He reports no foreign bodies present. Nothing aggravates the symptoms. Treatments tried: Vicodin. The treatment provided no relief.    HPI Comments:  Joshua Hale is a 46 y.o. male with pmhx of chronic pain and neuropathic pain who presents to the Emergency Department complaining of sudden-onset, constant, moderate left ankle pain onset two days ago. Pt states that he was sitting on the ground and when he stood up, he felt a pop and his leg gave out. He reports associated left knee pain that is chronic. Pt has taken vicodin at home, that he has for chronic knee pain, with some relief of symptoms.  Denies any weakness or numbness.   Past Medical History:  Diagnosis Date  . Crohn disease ([place])   . Post traumatic stress disorder (PTSD)     Patient Active Problem List   Diagnosis Date Noted  . Heel fracture 05/22/2015  . SHOULDER INSTABILITY 03/22/2008  . SHOULDER PAIN 03/22/2008    Past Surgical History:  Procedure Laterality Date  . ABDOMINAL SURGERY     stents  . CHOLECYSTECTOMY    . KNEE SURGERY    . thumb surgery         Home Medications    Prior to Admission medications   Medication Sig Start Date End Date Taking?  Authorizing Provider  diazepam (VALIUM) 10 MG tablet Take 10 mg by mouth 4 (four) times daily. And take 2 tablets at bedtime     Historical Provider, MD  diclofenac (VOLTAREN) 50 MG EC tablet Take 1 tablet (50 mg total) by mouth 2 (two) times daily. 07/13/16   Hope Bunnie Pion, NP  gabapentin (NEURONTIN) 800 MG tablet Take 800 mg by mouth 2 (two) times daily.      Historical Provider, MD  HYDROcodone-acetaminophen (NORCO/VICODIN) 5-325 MG tablet Take 1 tablet by mouth every 6 (six) hours as needed for moderate pain (Must last 14 days.Do not take and drive a car or use machinery.). 07/07/16   Sanjuana Kava, MD  mirtazapine (REMERON) 30 MG tablet Take 30 mg by mouth at bedtime.      Historical Provider, MD  naproxen sodium (ANAPROX) 220 MG tablet Take 220 mg by mouth 2 (two) times daily with a meal.    Historical Provider, MD    Family History Family History  Problem Relation Age of Onset  . Hypertension Mother   . Heart failure Mother   . COPD Mother     Social History Social History  Substance Use Topics  . Smoking status: Current Every Day Smoker    Packs/day: 1.00  . Smokeless tobacco: Never Used  . Alcohol use No     Allergies   Baclofen; Fentanyl; Onion; and Ultram [tramadol]   Review of Systems  Review of Systems  Musculoskeletal: Positive for arthralgias and joint swelling.  Skin: Negative for wound.  Neurological: Negative for weakness and numbness.     Physical Exam Updated Vital Signs Pulse 94   Temp 98.4 F (36.9 C) (Oral)   Resp 18   Ht 5\' 5"  (1.651 m)   Wt 56.2 kg   SpO2 94%   BMI 20.63 kg/m   Physical Exam  Constitutional: He is oriented to person, place, and time. He appears well-developed and well-nourished. No distress.  HENT:  Head: Normocephalic and atraumatic.  Eyes: EOM are normal.  Neck: Neck supple.  Cardiovascular: Normal rate and intact distal pulses.   Pulmonary/Chest: Effort normal.  Musculoskeletal:       Left foot: There is tenderness.  There is normal capillary refill, no deformity and no laceration. Decreased range of motion: due to pain. Swelling: minimal.  Pedal pulses 2+. Adequate circulation. Swelling noted to the left foot. Tenderness to ankle and dorsum of foot.  Left foot slightly cooler than right which patient states is not new.   Neurological: He is alert and oriented to person, place, and time.  Skin: Skin is warm and dry.  Psychiatric: He has a normal mood and affect.  Nursing note and vitals reviewed.    ED Treatments / Results  DIAGNOSTIC STUDIES:  Oxygen Saturation is 94% on RA, normal by my interpretation.    COORDINATION OF CARE:  7:45 PM Discussed treatment plan with pt at bedside which includes imaging and pt agreed to plan.  Labs (all labs ordered are listed, but only abnormal results are displayed) Labs Reviewed - No data to display  Radiology Dg Ankle Complete Left  Result Date: 07/13/2016 CLINICAL DATA:  Left ankle pain. EXAM: LEFT ANKLE COMPLETE - 3+ VIEW COMPARISON:  Left foot radiographs 11/30/2014 FINDINGS: Suboptimal positioning on the AP radiograph. No evidence of acute fracture or dislocation. Bone mineralization appears normal. No significant arthropathic changes are identified. There may be minimal soft tissue swelling about the ankle. IMPRESSION: No acute osseous abnormality identified. Electronically Signed   By: Logan Bores M.D.   On: 07/13/2016 20:31   Dg Knee Complete 4 Views Left  Result Date: 07/13/2016 CLINICAL DATA:  Left knee and ankle pain EXAM: LEFT KNEE - COMPLETE 4+ VIEW COMPARISON:  09/19/2015 FINDINGS: No joint effusion. Postsurgical changes involving the tibial tubercle is identified. No acute fracture or dislocation. IMPRESSION: No acute bone abnormality. Electronically Signed   By: Kerby Moors M.D.   On: 07/13/2016 20:29    Procedures Procedures (including critical care time)  Medications Ordered in ED Medications - No data to display   Initial Impression  / Assessment and Plan / ED Course  I have reviewed the triage vital signs and the nursing notes.  Pertinent imaging results that were available during my care of the patient were reviewed by me and considered in my medical decision making (see chart for details).   Patient X-Ray negative for obvious fracture or dislocation.  Pt advised to follow up with PCP or  orthopedics. Patient given ASO and crutches while in ED, conservative therapy recommended and discussed. Patient will be discharged home & is agreeable with above plan. Returns precautions discussed. Pt appears safe for discharge.  ASO checked after applied and patient feels some better and adequate circulation.   Final Clinical Impressions(s) / ED Diagnoses   Final diagnoses:  Sprain of left ankle, unspecified ligament, initial encounter  Foot sprain, left, initial encounter    New  Prescriptions New Prescriptions   DICLOFENAC (VOLTAREN) 50 MG EC TABLET    Take 1 tablet (50 mg total) by mouth 2 (two) times daily.   I personally performed the services described in this documentation, which was scribed in my presence. The recorded information has been reviewed and is accurate.     [place], [place] 07/13/16 2044    Daleen Bo, MD 07/14/16 1006

## 2016-07-14 ENCOUNTER — Encounter: Payer: Self-pay | Admitting: Orthopaedic Surgery

## 2016-07-17 ENCOUNTER — Encounter: Payer: Self-pay | Admitting: Orthopaedic Surgery

## 2016-07-17 ENCOUNTER — Ambulatory Visit (INDEPENDENT_AMBULATORY_CARE_PROVIDER_SITE_OTHER): Payer: Medicare Other | Admitting: Orthopaedic Surgery

## 2016-07-17 VITALS — BP 130/75 | HR 97 | Temp 99.1°F | Ht 65.0 in | Wt 124.0 lb

## 2016-07-17 DIAGNOSIS — M79672 Pain in left foot: Secondary | ICD-10-CM

## 2016-07-17 DIAGNOSIS — M25572 Pain in left ankle and joints of left foot: Secondary | ICD-10-CM | POA: Diagnosis not present

## 2016-07-17 NOTE — Patient Instructions (Signed)
Steps to Quit Smoking Smoking tobacco can be bad for your health. It can also affect almost every organ in your body. Smoking puts you and people around you at risk for many serious long-lasting (chronic) diseases. Quitting smoking is hard, but it is one of the best things that you can do for your health. It is never too late to quit. What are the benefits of quitting smoking? When you quit smoking, you lower your risk for getting serious diseases and conditions. They can include:  Lung cancer or lung disease.  Heart disease.  Stroke.  Heart attack.  Not being able to have children (infertility).  Weak bones (osteoporosis) and broken bones (fractures). If you have coughing, wheezing, and shortness of breath, those symptoms may get better when you quit. You may also get sick less often. If you are pregnant, quitting smoking can help to lower your chances of having a baby of low birth weight. What can I do to help me quit smoking? Talk with your doctor about what can help you quit smoking. Some things you can do (strategies) include:  Quitting smoking totally, instead of slowly cutting back how much you smoke over a period of time.  Going to in-person counseling. You are more likely to quit if you go to many counseling sessions.  Using resources and support systems, such as:  Online chats with a counselor.  Phone quitlines.  Printed self-help materials.  Support groups or group counseling.  Text messaging programs.  Mobile phone apps or applications.  Taking medicines. Some of these medicines may have nicotine in them. If you are pregnant or breastfeeding, do not take any medicines to quit smoking unless your doctor says it is okay. Talk with your doctor about counseling or other things that can help you. Talk with your doctor about using more than one strategy at the same time, such as taking medicines while you are also going to in-person counseling. This can help make quitting  easier. What things can I do to make it easier to quit? Quitting smoking might feel very hard at first, but there is a lot that you can do to make it easier. Take these steps:  Talk to your family and friends. Ask them to support and encourage you.  Call phone quitlines, reach out to support groups, or work with a counselor.  Ask people who smoke to not smoke around you.  Avoid places that make you want (trigger) to smoke, such as:  Bars.  Parties.  Smoke-break areas at work.  Spend time with people who do not smoke.  Lower the stress in your life. Stress can make you want to smoke. Try these things to help your stress:  Getting regular exercise.  Deep-breathing exercises.  Yoga.  Meditating.  Doing a body scan. To do this, close your eyes, focus on one area of your body at a time from head to toe, and notice which parts of your body are tense. Try to relax the muscles in those areas.  Download or buy apps on your mobile phone or tablet that can help you stick to your quit plan. There are many free apps, such as QuitGuide from the CDC (Centers for Disease Control and Prevention). You can find more support from smokefree.gov and other websites. This information is not intended to replace advice given to you by your health care provider. Make sure you discuss any questions you have with your health care provider. Document Released: 01/25/2009 Document Revised: 11/27/2015 Document   Reviewed: 08/15/2014 Elsevier Interactive Patient Education  2017 Elsevier Inc.  

## 2016-07-17 NOTE — Progress Notes (Signed)
Patient Joshua Hale Joshua Hale, male DOB:[birth year], 46 y.o. EAV:[id number]  Chief Complaint  Patient presents with  . Ankle Injury    left, DOI 07/11/16    HPI  [name] is a 46 y.o. male who fell on Easter Sunday, 07-13-16 and hurt his left foot and ankle.  He was seen in the ER.  X-rays were negative.  He is on crutches.  He has no other injury.  He is a little better but the foot remains swollen and tender.  I have given instruction sheet for contrast baths.  He is to continue his crutches and elevation. HPI  Body mass index is 20.63 kg/m.  ROS  Review of Systems  Constitutional:       Patient does not have Diabetes Mellitus. Patient does not have hypertension. Patient has COPD or shortness of breath. Patient does not have BMI > 35. Patient has current smoking history.  HENT: Negative for congestion.   Respiratory: Positive for cough and shortness of breath.   Endocrine: Positive for cold intolerance.  Musculoskeletal: Positive for arthralgias, gait problem, joint swelling and myalgias.  Allergic/Immunologic: Positive for environmental allergies.    Past Medical History:  Diagnosis Date  . Crohn disease ([place])   . Post traumatic stress disorder (PTSD)     Past Surgical History:  Procedure Laterality Date  . ABDOMINAL SURGERY     stents  . CHOLECYSTECTOMY    . KNEE SURGERY    . thumb surgery      Family History  Problem Relation Age of Onset  . Hypertension Mother   . Heart failure Mother   . COPD Mother     Social History Social History  Substance Use Topics  . Smoking status: Current Every Day Smoker    Packs/day: 1.00  . Smokeless tobacco: Never Used  . Alcohol use No    Allergies  Allergen Reactions  . Baclofen Swelling  . Fentanyl Hives  . Onion Swelling  . Ultram [Tramadol] Nausea Only    Current Outpatient Prescriptions  Medication Sig Dispense Refill  . diazepam (VALIUM) 10 MG tablet Take 10 mg by mouth 4 (four) times daily. And take 2  tablets at bedtime     . diclofenac (VOLTAREN) 50 MG EC tablet Take 1 tablet (50 mg total) by mouth 2 (two) times daily. 15 tablet 0  . gabapentin (NEURONTIN) 800 MG tablet Take 800 mg by mouth 2 (two) times daily.      Marland Kitchen HYDROcodone-acetaminophen (NORCO/VICODIN) 5-325 MG tablet Take 1 tablet by mouth every 6 (six) hours as needed for moderate pain (Must last 14 days.Do not take and drive a car or use machinery.). 25 tablet 0  . mirtazapine (REMERON) 30 MG tablet Take 30 mg by mouth at bedtime.      . naproxen sodium (ANAPROX) 220 MG tablet Take 220 mg by mouth 2 (two) times daily with a meal.     No current facility-administered medications for this visit.      Physical Exam  Blood pressure 130/75, pulse 97, temperature 99.1 F (37.3 C), height 5\' 5"  (1.651 m), weight 124 lb (56.2 kg).  Constitutional: overall normal hygiene, normal nutrition, well developed, normal grooming, normal body habitus. Assistive device:crutches  Musculoskeletal: gait and station Limp left, muscle tone and strength are normal, no tremors or atrophy is present.  .  Neurological: coordination overall normal.  Deep tendon reflex/nerve stretch intact.  Sensation normal.  Cranial nerves II-XII intact.   Skin:   Normal overall no  scars, lesions, ulcers or rashes. No psoriasis.  Psychiatric: Alert and oriented x 3.  Recent memory intact, remote memory unclear.  Normal mood and affect. Well groomed.  Good eye contact.  Cardiovascular: overall no swelling, no varicosities, no edema bilaterally, normal temperatures of the legs and arms, no clubbing, cyanosis and good capillary refill.  Lymphatic: palpation is normal.  The left ankle is tender but has no swelling, ROM is full.  The left foot has swelling with more pain dorsally and over the third through fifth metatarsal area.  NV is intact.  The patient has been educated about the nature of the problem(s) and counseled on treatment options.  The patient appeared  to understand what I have discussed and is in agreement with it.  Encounter Diagnoses  Name Primary?  . Left foot pain Yes  . Acute left ankle pain     PLAN Call if any problems.  Precautions discussed.  Continue current medications.   Return to clinic 2 weeks   Electronically Signed Sanjuana Kava, MD 4/5/20189:07 AM

## 2016-07-22 ENCOUNTER — Telehealth: Payer: Self-pay | Admitting: Orthopaedic Surgery

## 2016-07-22 MED ORDER — HYDROCODONE-ACETAMINOPHEN 5-325 MG PO TABS
1.0000 | ORAL_TABLET | Freq: Four times a day (QID) | ORAL | 0 refills | Status: DC | PRN
Start: 2016-07-22 — End: 2016-08-05

## 2016-07-31 ENCOUNTER — Ambulatory Visit (INDEPENDENT_AMBULATORY_CARE_PROVIDER_SITE_OTHER): Payer: Medicare Other | Admitting: Orthopaedic Surgery

## 2016-07-31 ENCOUNTER — Encounter: Payer: Self-pay | Admitting: Orthopaedic Surgery

## 2016-07-31 VITALS — BP 126/80 | HR 108 | Temp 96.8°F | Resp 18 | Ht 65.0 in | Wt 124.0 lb

## 2016-07-31 DIAGNOSIS — M25572 Pain in left ankle and joints of left foot: Secondary | ICD-10-CM | POA: Diagnosis not present

## 2016-07-31 NOTE — Progress Notes (Signed)
Patient KG:MWNU [name], male DOB:12/23/1970, 46 y.o. UVO:[id number]  Chief Complaint  Patient presents with  . Follow-up    Recheck left ankle.    HPI  MARICE ANGELINO is a 46 y.o. male who has continued pain of the left ankle.  He has decreased motion and complains of numbness at times.  He has no new trauma, no swelling, no redness. HPI  Body mass index is 20.63 kg/m.  ROS  Review of Systems  Constitutional:       Patient does not have Diabetes Mellitus. Patient does not have hypertension. Patient has COPD or shortness of breath. Patient does not have BMI > 35. Patient has current smoking history.  HENT: Negative for congestion.   Respiratory: Positive for cough and shortness of breath.   Endocrine: Positive for cold intolerance.  Musculoskeletal: Positive for arthralgias, gait problem, joint swelling and myalgias.  Allergic/Immunologic: Positive for environmental allergies.    Past Medical History:  Diagnosis Date  . Crohn disease ([place])   . Post traumatic stress disorder (PTSD)     Past Surgical History:  Procedure Laterality Date  . ABDOMINAL SURGERY     stents  . CHOLECYSTECTOMY    . KNEE SURGERY    . thumb surgery      Family History  Problem Relation Age of Onset  . Hypertension Mother   . Heart failure Mother   . COPD Mother     Social History Social History  Substance Use Topics  . Smoking status: Current Every Day Smoker    Packs/day: 1.00  . Smokeless tobacco: Never Used  . Alcohol use No    Allergies  Allergen Reactions  . Baclofen Swelling  . Fentanyl Hives  . Onion Swelling  . Ultram [Tramadol] Nausea Only    Current Outpatient Prescriptions  Medication Sig Dispense Refill  . diazepam (VALIUM) 10 MG tablet Take 10 mg by mouth 4 (four) times daily. And take 2 tablets at bedtime     . diclofenac (VOLTAREN) 50 MG EC tablet Take 1 tablet (50 mg total) by mouth 2 (two) times daily. 15 tablet 0  . gabapentin (NEURONTIN) 800 MG tablet Take  800 mg by mouth 2 (two) times daily.      Marland Kitchen HYDROcodone-acetaminophen (NORCO/VICODIN) 5-325 MG tablet Take 1 tablet by mouth every 6 (six) hours as needed for moderate pain (Must last 14 days.Do not take and drive a car or use machinery.). 20 tablet 0  . mirtazapine (REMERON) 30 MG tablet Take 30 mg by mouth at bedtime.      . naproxen sodium (ANAPROX) 220 MG tablet Take 220 mg by mouth 2 (two) times daily with a meal.     No current facility-administered medications for this visit.      Physical Exam  Blood pressure 126/80, pulse (!) 108, temperature (!) 96.8 F (36 C), resp. rate 18, height 5\' 5"  (1.651 m), weight 124 lb (56.2 kg).  Constitutional: overall normal hygiene, normal nutrition, well developed, normal grooming, normal body habitus. Assistive device:ankle brace left  Musculoskeletal: gait and station Limp left, muscle tone and strength are normal, no tremors or atrophy is present.  .  Neurological: coordination overall normal.  Deep tendon reflex/nerve stretch intact.  Sensation normal.  Cranial nerves II-XII intact.   Skin:   Normal overall no scars, lesions, ulcers or rashes. No psoriasis.  Psychiatric: Alert and oriented x 3.  Recent memory intact, remote memory unclear.  Normal mood and affect. Well groomed.  Good eye  contact.  Cardiovascular: overall no swelling, no varicosities, no edema bilaterally, normal temperatures of the legs and arms, no clubbing, cyanosis and good capillary refill.  Lymphatic: palpation is normal.  He has crutches as well.  He has only 15 degrees plantar flexion and 5 dorsiflexion of the ankle on the left.  He has no swelling.   The patient has been educated about the nature of the problem(s) and counseled on treatment options.  The patient appeared to understand what I have discussed and is in agreement with it.  Encounter Diagnosis  Name Primary?  . Acute left ankle pain Yes    PLAN Call if any problems.  Precautions discussed.   Continue current medications.   Return to clinic 1 month   Begin PT for the ankle.  Continue the crutches.  A CAM walker is given.  Electronically Signed Sanjuana Kava, MD 4/19/20183:28 PM

## 2016-08-01 DIAGNOSIS — Z1389 Encounter for screening for other disorder: Secondary | ICD-10-CM | POA: Diagnosis not present

## 2016-08-01 DIAGNOSIS — M545 Low back pain: Secondary | ICD-10-CM | POA: Diagnosis not present

## 2016-08-01 DIAGNOSIS — Z0000 Encounter for general adult medical examination without abnormal findings: Secondary | ICD-10-CM | POA: Diagnosis not present

## 2016-08-01 DIAGNOSIS — I10 Essential (primary) hypertension: Secondary | ICD-10-CM | POA: Diagnosis not present

## 2016-08-05 ENCOUNTER — Telehealth: Payer: Self-pay | Admitting: Orthopaedic Surgery

## 2016-08-05 MED ORDER — HYDROCODONE-ACETAMINOPHEN 5-325 MG PO TABS: 1.0000 | ORAL_TABLET | Freq: Four times a day (QID) | ORAL | 0 refills | Status: DC | PRN

## 2016-08-12 ENCOUNTER — Ambulatory Visit (HOSPITAL_COMMUNITY): Payer: Medicare Other | Attending: Orthopaedic Surgery | Admitting: Physical Therapy

## 2016-08-12 ENCOUNTER — Encounter (HOSPITAL_COMMUNITY): Payer: Self-pay | Admitting: Physical Therapy

## 2016-08-12 DIAGNOSIS — M25672 Stiffness of left ankle, not elsewhere classified: Secondary | ICD-10-CM

## 2016-08-12 DIAGNOSIS — R600 Localized edema: Secondary | ICD-10-CM | POA: Diagnosis not present

## 2016-08-12 DIAGNOSIS — M6281 Muscle weakness (generalized): Secondary | ICD-10-CM

## 2016-08-12 DIAGNOSIS — R262 Difficulty in walking, not elsewhere classified: Secondary | ICD-10-CM

## 2016-08-12 DIAGNOSIS — R296 Repeated falls: Secondary | ICD-10-CM | POA: Diagnosis not present

## 2016-08-12 NOTE — Patient Instructions (Signed)
   ANKLE CIRCLES  Move your ankle in a circular pattern one direction for several repetitions and then reverse the direction.   Repeat each direction x15, twice a day.    SEATED CALF STRETCH - GASTROC  While sitting, use a towel or other strap looped around your foot. Gently pull your ankle back until a stretch is felt along the back of your lower leg.  Hold for at least 60 seconds, and 5 repetitions. Repeat at least 3 sets per day.    MASSAGE TO ACHILLES/GASTROC  Use lanolin or coconut oil to the back of the calf/achilles tendon and massage this area, using as much pressure as you can tolerate.  Continue for 8-12 minutes, repeat 1-2 times per day.

## 2016-08-12 NOTE — Therapy (Signed)
[place], [place], [address] Phone: [phone]   Fax:  [phone]  Physical Therapy Evaluation  Patient Details  Name: BEATRIZ SETTLES MRN: [id number] Date of Birth: 08-16-1970 Referring Provider: Sanjuana Kava   Encounter Date: 08/12/2016      PT End of Session - 08/12/16 1526    Visit Number 1   Number of Visits 17   Date for PT Re-Evaluation 09/09/16   Authorization Type Medicare (will need G-codes and KX)   Authorization Time Period 08/12/16 to 10/12/16   Authorization - Visit Number 1   Authorization - Number of Visits 10   PT Start Time 4235   PT Stop Time 1513   PT Time Calculation (min) 39 min   Activity Tolerance Patient tolerated treatment well   Behavior During Therapy [facility] for tasks assessed/performed      Past Medical History:  Diagnosis Date  . Crohn disease ([place])   . Post traumatic stress disorder (PTSD)     Past Surgical History:  Procedure Laterality Date  . ABDOMINAL SURGERY     stents  . CHOLECYSTECTOMY    . KNEE SURGERY    . thumb surgery      There were no vitals filed for this visit.       Subjective Assessment - 08/12/16 1439    Subjective patient reports that he jumped off of a truck and broke his heel about 1.5 years ago; "it healed up in its own way, if I had my way I'd have my leg cut off". He reports that on April 1st he just stood up and his L ankle rolled under him, he immediately had to sit back down. He went to the hospital in a few days after this happened and was negative for fracture. His injury really keeps him from playing instruments and doing what he needs to do daily. He reports that he fell when he was standing up from the ground when he rolled his ankle, he also fell the other day twice. He does not know how long he will be in the boot.    Pertinent History hx of heel fx L 1.5 years ago, Chrons, PTSD, x10 surgeries L knee, "I have a hole in my R lung", thumb  replacement L    How long can you stand comfortably? 5-10 minutes    How long can you walk comfortably? 5-10 minutes    Patient Stated Goals use foot normally again, walk normal, play instruments    Currently in Pain? Yes   Pain Score 8    Pain Location Ankle   Pain Orientation Left   Pain Descriptors / Indicators Sharp;Throbbing;Constant   Pain Type Chronic pain;Acute pain   Pain Radiating Towards none    Pain Onset 1 to 4 weeks ago   Pain Frequency Constant   Aggravating Factors  standing and walking on it    Pain Relieving Factors rubbing it, massaging it    Effect of Pain on Daily Activities severe impact             [facility] PT Assessment - 08/12/16 0001      Assessment   Medical Diagnosis L ankle pain    Referring Provider Sanjuana Kava    Onset Date/Surgical Date 07/13/16   Next MD Visit Dr. Luna Glasgow in late May    Prior Therapy none      Precautions   Precautions Fall;Other (comment)  CAM boot  Balance Screen   Has the patient fallen in the past 6 months Yes   How many times? falls at least 3-4 times per day due to the knee    Has the patient had a decrease in activity level because of a fear of falling?  Yes   Is the patient reluctant to leave their home because of a fear of falling?  No     Prior Function   Level of Independence Independent;Independent with basic ADLs;Independent with gait;Independent with transfers   Vocation On disability;Retired   Leisure Corporate treasurer, listening to music      Observation/Other Assessments   Observations sores noted L LE however patient states this is due to medication/MD aware; gross atrophy      AROM   Right Ankle Dorsiflexion 16   Right Ankle Plantar Flexion --  WFL    Right Ankle Inversion 35  hip compensation    Right Ankle Eversion 16   Left Ankle Dorsiflexion -8   Left Ankle Plantar Flexion 50   Left Ankle Inversion 23   Left Ankle Eversion 2     Strength   Right Knee Flexion 5/5   Right Knee  Extension 5/5   Left Knee Flexion 3+/5   Left Knee Extension 4-/5   Right Ankle Dorsiflexion 5/5   Right Ankle Inversion 5/5   Right Ankle Eversion 5/5   Left Ankle Dorsiflexion 3-/5  ROM limited    Left Ankle Inversion 3/5   Left Ankle Eversion 3/5  ROM limited      Ambulation/Gait   Gait Comments reduced gait speed, reduced toe off/push off, step through pattern but antalgic, B crutches                            PT Education - 08/12/16 1526    Education provided Yes   Education Details prognosis, examination findings, POC, HEP    Person(s) Educated Patient   Methods Explanation;Demonstration;Other (comment)   Comprehension Verbalized understanding;Returned demonstration;Need further instruction          PT Short Term Goals - 08/12/16 1530      PT SHORT TERM GOAL #1   Title Patient to demonstrate L ankle ROM as having had improved by 10 degrees all measured directions in order to reduce pain and improve mechanics    Time 4   Period Weeks   Status New     PT SHORT TERM GOAL #2   Title Patient to experience L ankle pain no more than 4/10 in order to improve QOL and functional task tolerance    Time 4   Period Weeks   Status New     PT SHORT TERM GOAL #3   Title Patient to be able to ambulate with LRAD with minimal unsteadiness and pain no more than 4/10 L foot/ankle in order to improve general mobility    Time 4   Period Weeks   Status New     PT SHORT TERM GOAL #4   Title Patient to be able to verbalize importance of supportive footwear/bracing for L foot/ankle in order to prevent further ankle/foot injury    Time 4   Period Weeks   Status New           PT Long Term Goals - 08/12/16 1534      PT LONG TERM GOAL #1   Title Patient to improve strength in all tested groups by at least one MMT grade  in order to assist in improving overall strength and balance    Time 8   Period Weeks   Status New     PT LONG TERM GOAL #2   Title  Patient to be able to complete TUG test with LRAD in 15 seconds or less and be able to perform SLS for at least 10 seconds with no HHA in order to show imrpoved balance and reduced fall risk    Time 8   Period Weeks   Status New     PT LONG TERM GOAL #3   Title Patient to report he has been able to tolerate playing his instruments with L ankle/foot pain no more than 2/10 in order to improve QOL    Time 8   Period Weeks   Status New     PT LONG TERM GOAL #4   Title Patient to demonstrate reduced favoring of L LE during all functional tasks in order to assist in building muscular strength, reduce compensations, and to reduce falls related to LE weakness moving forward    Time 8   Period Weeks   Status New               Plan - 08/12/16 1527    Clinical Impression Statement Patient arrives with L ankle pain that he reports started when he stood up from sitting on the grass and rolled his L ankle, collapsing back down to the ground; he also reports about 10 surgeries all on his L knee as well as a L heel fracture about 1.5 years ago that according to him did not heal right. Examination reveals gross and severe atrophy L LE as well as severe L ankle stiffness including plantar flexor contraction, gait deviation, impaired functional balance, and reduced ability to safely participate in dynamic tasks. He reports that he falls 3-4 times/day due to knee pain; he also demonstrates open LE sores which he reports MD is aware of and is from gabapentin per patient report although will plan to follow up with MD regarding this. Recommend skilled PT services in order to address functional deficits, reduce fall risk, and optimize overall level of function.    Rehab Potential Fair   Clinical Impairments Affecting Rehab Potential (+) motivated to participate in PT; (-) multiple injuries/surgeries to L LE, chronic L LE disuse/atrophy, chronicity of L ankle/foot problems   PT Frequency 2x / week   PT  Duration 8 weeks   PT Treatment/Interventions ADLs/Self Care Home Management;Biofeedback;Cryotherapy;Electrical Stimulation;Moist Heat;Ultrasound;DME Instruction;Gait training;Stair training;Functional mobility training;Therapeutic activities;Therapeutic exercise;Balance training;Neuromuscular re-education;Patient/family education;Manual techniques;Passive range of motion;Dry needling;Taping   PT Next Visit Plan review initial eval and goals, HEP; PATIENT CANNOT READ BUT WIFE CAN HELP HIM. Edema control, manual heavy focus for ankle ROM and to reduce PF contracture. Gait training. Foot intrinsics.    PT Home Exercise Plan Eval: ankle circles, gastroc stretch with extended hold, gastoc/achilles massage    Recommended Other Services possible JAS brace and/or specialty ankle brace for ambulation    Consulted and Agree with Plan of Care Patient      Patient will benefit from skilled therapeutic intervention in order to improve the following deficits and impairments:  Abnormal gait, Increased fascial restricitons, Improper body mechanics, Pain, Decreased coordination, Decreased mobility, Postural dysfunction, Decreased activity tolerance, Decreased range of motion, Decreased strength, Hypomobility, Decreased balance, Difficulty walking, Increased edema, Impaired flexibility, Decreased safety awareness  Visit Diagnosis: Stiffness of left ankle, not elsewhere classified - Plan: PT plan of care cert/re-cert  Difficulty in walking, not elsewhere classified - Plan: PT plan of care cert/re-cert  Localized edema - Plan: PT plan of care cert/re-cert  Repeated falls - Plan: PT plan of care cert/re-cert  Muscle weakness (generalized) - Plan: PT plan of care cert/re-cert      G-Codes - 22/63/33 1537    Functional Assessment Tool Used (Outpatient Only) Based on skilled clinical assessment of ROM, balance, strength, gait, fall risk    Functional Limitation Mobility: Walking and moving around   Mobility:  Walking and Moving Around Current Status (L4562) At least 60 percent but less than 80 percent impaired, limited or restricted   Mobility: Walking and Moving Around Goal Status [phone]) At least 40 percent but less than 60 percent impaired, limited or restricted       Problem List Patient Active Problem List   Diagnosis Date Noted  . Heel fracture 05/22/2015  . SHOULDER INSTABILITY 03/22/2008  . SHOULDER PAIN 03/22/2008    Deniece Ree PT, DPT [facility] [address] [place], [place], [address] Phone: [phone]   Fax:  [phone]  Name: ALRICK CUBBAGE MRN: [id number] Date of Birth: [birth year]

## 2016-08-13 ENCOUNTER — Telehealth (HOSPITAL_COMMUNITY): Payer: Self-pay | Admitting: Physical Therapy

## 2016-08-13 ENCOUNTER — Telehealth: Payer: Self-pay | Admitting: Orthopaedic Surgery

## 2016-08-13 NOTE — Telephone Encounter (Signed)
Kristen from [facility] called and had a couple of questions/issues for you:  1.  When can Mr. Segal get into a regular shoe?  Nowthen states she questioned Mr. Nawaz regarding the sores on his face and body and he told her that it was from Gabapentin.  She wanted to know if Dr. Luna Glasgow was aware of this.  Would you please address these questions for me?  Thanks

## 2016-08-13 NOTE — Telephone Encounter (Signed)
Called and spoke to front office staff at Dr. Brooke Bonito office; asked for clarification on when patient can come out of boot as well as wanting to touch base about open sores around patient's face/legs that patient states is due to gabapentin.  Left callback number of PT clinic; staff reports Dr. Luna Glasgow is out until Tuesday but they will call us back.  Deniece Ree PT, DPT [phone]

## 2016-08-18 ENCOUNTER — Ambulatory Visit (HOSPITAL_COMMUNITY): Payer: Medicare Other | Admitting: Physical Therapy

## 2016-08-19 ENCOUNTER — Other Ambulatory Visit: Payer: Self-pay | Admitting: Orthopaedic Surgery

## 2016-08-19 NOTE — Telephone Encounter (Signed)
He can go into shoe whenever he wants.  I do not give gabapentin.  You need to address with the doctor who does.

## 2016-08-20 ENCOUNTER — Encounter: Payer: Self-pay | Admitting: Orthopaedic Surgery

## 2016-08-20 ENCOUNTER — Ambulatory Visit (INDEPENDENT_AMBULATORY_CARE_PROVIDER_SITE_OTHER): Payer: Medicare Other | Admitting: Orthopaedic Surgery

## 2016-08-20 ENCOUNTER — Ambulatory Visit (HOSPITAL_COMMUNITY): Payer: Medicare Other

## 2016-08-20 VITALS — BP 119/78 | HR 98 | Temp 97.7°F | Resp 18 | Ht 67.0 in | Wt 133.0 lb

## 2016-08-20 DIAGNOSIS — M25672 Stiffness of left ankle, not elsewhere classified: Secondary | ICD-10-CM | POA: Diagnosis not present

## 2016-08-20 DIAGNOSIS — R262 Difficulty in walking, not elsewhere classified: Secondary | ICD-10-CM

## 2016-08-20 DIAGNOSIS — M6281 Muscle weakness (generalized): Secondary | ICD-10-CM | POA: Diagnosis not present

## 2016-08-20 DIAGNOSIS — F17210 Nicotine dependence, cigarettes, uncomplicated: Secondary | ICD-10-CM | POA: Diagnosis not present

## 2016-08-20 DIAGNOSIS — R296 Repeated falls: Secondary | ICD-10-CM | POA: Diagnosis not present

## 2016-08-20 DIAGNOSIS — M25572 Pain in left ankle and joints of left foot: Secondary | ICD-10-CM | POA: Diagnosis not present

## 2016-08-20 DIAGNOSIS — R600 Localized edema: Secondary | ICD-10-CM | POA: Diagnosis not present

## 2016-08-20 NOTE — Therapy (Signed)
[place], [place], [address] Phone: [phone]   Fax:  [phone]  Physical Therapy Treatment  Patient Details  Name: Joshua Hale MRN: [id number] Date of Birth: [birth year] Referring Provider: Sanjuana Kava   Encounter Date: 08/20/2016      PT End of Session - 08/20/16 1430    Visit Number 2   Number of Visits 17   Date for PT Re-Evaluation 09/09/16   Authorization Type Medicare (will need G-codes and KX)   Authorization Time Period 08/12/16 to 10/12/16   Authorization - Visit Number 2   Authorization - Number of Visits 10   PT Start Time 6812   PT Stop Time 1515   PT Time Calculation (min) 46 min   Activity Tolerance Patient tolerated treatment well   Behavior During Therapy [facility] for tasks assessed/performed      Past Medical History:  Diagnosis Date  . Crohn disease ([place])   . Post traumatic stress disorder (PTSD)     Past Surgical History:  Procedure Laterality Date  . ABDOMINAL SURGERY     stents  . CHOLECYSTECTOMY    . KNEE SURGERY    . thumb surgery      There were no vitals filed for this visit.      Subjective Assessment - 08/20/16 1433    Subjective Pt reports high pain scale 8/10 Lt ankle contast sharp shooting pain.  Reports compliance iwth HEP with increased Lt knee pain during ankle circles.     Pertinent History hx of heel fx L 1.5 years ago, Chrons, PTSD, x10 surgeries L knee, "I have a hole in my R lung", thumb replacement L    Patient Stated Goals use foot normally again, walk normal, play instruments    Currently in Pain? Yes   Pain Score 8    Pain Location Ankle   Pain Orientation Left   Pain Descriptors / Indicators Sharp;Constant;Shooting   Pain Type Chronic pain   Pain Radiating Towards none   Pain Onset 1 to 4 weeks ago   Pain Frequency Constant   Aggravating Factors  standing and walking on it   Pain Relieving Factors rubbing and massage it   Effect of Pain on Daily  Activities severe impact              [facility] Adult PT Treatment/Exercise - 08/20/16 0001      Exercises   Exercises Ankle     Manual Therapy   Manual Therapy Edema management;Soft tissue mobilization;Passive ROM   Manual therapy comments manual complete separate rest of tx   Edema Management Retro massage with LE elevated   Soft tissue mobilization STM to gastroc/soleus complex   Passive ROM PROM all directions per tolerance     Ankle Exercises: Stretches   Gastroc Stretch 3 reps;30 seconds   Gastroc Stretch Limitations with sheet     Ankle Exercises: Seated   ABC's Limitations begin next session   Ankle Circles/Pumps 10 reps   Ankle Circles/Pumps Limitations each direction   Towel Crunch 1 rep   Towel Inversion/Eversion 2 reps   Marble Pickup 10 marbles 5 big 5 small   Heel Raises 15 reps   Toe Raise 15 reps                PT Education - 08/20/16 1457    Education provided Yes   Education Details Reviewed goals, assured compliance and proper form/tech with HEP, copy of eval given to  pt.  Educated RICE techniques for pain and edema control.  Educated purpose and benefits with retro massage for edema control.  Requested pt to bring in tennis shoes next session.  Encouraged pt to call MD about gabapentin medication to address sores on body.Terence Lux) Educated Patient   Methods Explanation;Demonstration;Handout   Comprehension Verbalized understanding;Returned demonstration;Need further instruction          PT Short Term Goals - 08/12/16 1530      PT SHORT TERM GOAL #1   Title Patient to demonstrate L ankle ROM as having had improved by 10 degrees all measured directions in order to reduce pain and improve mechanics    Time 4   Period Weeks   Status New     PT SHORT TERM GOAL #2   Title Patient to experience L ankle pain no more than 4/10 in order to improve QOL and functional task tolerance    Time 4   Period Weeks   Status New     PT SHORT TERM  GOAL #3   Title Patient to be able to ambulate with LRAD with minimal unsteadiness and pain no more than 4/10 L foot/ankle in order to improve general mobility    Time 4   Period Weeks   Status New     PT SHORT TERM GOAL #4   Title Patient to be able to verbalize importance of supportive footwear/bracing for L foot/ankle in order to prevent further ankle/foot injury    Time 4   Period Weeks   Status New           PT Long Term Goals - 08/12/16 1534      PT LONG TERM GOAL #1   Title Patient to improve strength in all tested groups by at least one MMT grade in order to assist in improving overall strength and balance    Time 8   Period Weeks   Status New     PT LONG TERM GOAL #2   Title Patient to be able to complete TUG test with LRAD in 15 seconds or less and be able to perform SLS for at least 10 seconds with no HHA in order to show imrpoved balance and reduced fall risk    Time 8   Period Weeks   Status New     PT LONG TERM GOAL #3   Title Patient to report he has been able to tolerate playing his instruments with L ankle/foot pain no more than 2/10 in order to improve QOL    Time 8   Period Weeks   Status New     PT LONG TERM GOAL #4   Title Patient to demonstrate reduced favoring of L LE during all functional tasks in order to assist in building muscular strength, reduce compensations, and to reduce falls related to LE weakness moving forward    Time 8   Period Weeks   Status New               Plan - 08/20/16 1507    Clinical Impression Statement Reviewed goals, assured compliance and proper form/tech with HEP, copy of eval given to pt.  Educated RICE techniques for pain and edema control.  Session focus on improving ankle mobiltiy with manual techniques to address edema control, reduce tightness in gastroc/soleus complex and PROM per pt tolerance.  Therex focus on ankle mobilty with stretches and intrinsic musculature strengthening.  Pt noticed tick between  toes during towel crunch,  reviewed importance of hygiene for infection control.  Requested pt to bring in tennis shoes next session to begin gait training out of CAM boot.  Encouraged pt to call MD about gabapentin medication to address sores on body, pt given contact info for Dr. Velvet Bathe A. Hasanaj, MD.  Pt stated he has been on gabapentin since the 1990s though the sores have been present for 3-4 years.     Rehab Potential Fair   Clinical Impairments Affecting Rehab Potential (+) motivated to participate in PT; (-) multiple injuries/surgeries to L LE, chronic L LE disuse/atrophy, chronicity of L ankle/foot problems   PT Frequency 2x / week   PT Duration 8 weeks   PT Treatment/Interventions ADLs/Self Care Home Management;Biofeedback;Cryotherapy;Electrical Stimulation;Moist Heat;Ultrasound;DME Instruction;Gait training;Stair training;Functional mobility training;Therapeutic activities;Therapeutic exercise;Balance training;Neuromuscular re-education;Patient/family education;Manual techniques;Passive range of motion;Dry needling;Taping   PT Next Visit Plan Requested pt to bring tennis shoes next session, begin gait training.  Continue with manual for edema control and ankle ROM and to reduce PF contracture.  Foot intrinsics strengthening and progress to standing therex when mobility improves.     PT Home Exercise Plan Eval: ankle circles, gastroc stretch with extended hold, gastoc/achilles massage    Recommended Other Services possible JAS brace and/or specialty ankle brace for ambulation      Patient will benefit from skilled therapeutic intervention in order to improve the following deficits and impairments:  Abnormal gait, Increased fascial restricitons, Improper body mechanics, Pain, Decreased coordination, Decreased mobility, Postural dysfunction, Decreased activity tolerance, Decreased range of motion, Decreased strength, Hypomobility, Decreased balance, Difficulty walking, Increased edema, Impaired  flexibility, Decreased safety awareness  Visit Diagnosis: Stiffness of left ankle, not elsewhere classified  Difficulty in walking, not elsewhere classified  Localized edema  Repeated falls  Muscle weakness (generalized)     Problem List Patient Active Problem List   Diagnosis Date Noted  . Heel fracture 05/22/2015  . SHOULDER INSTABILITY 03/22/2008  . SHOULDER PAIN 03/22/2008   Ihor Austin, Ruby; [place]  Aldona Lento 08/20/2016, 4:06 PM  [facility] [address] [place], [place], [address] Phone: [phone]   Fax:  [phone]  Name: ANDI MAHAFFY MRN: [id number] Date of Birth: 1970-08-20

## 2016-08-20 NOTE — Progress Notes (Signed)
Patient CH:[name], male DOB:1971-03-21, 46 y.o. Joshua Hale  Chief Complaint  Patient presents with  . Follow-up    Recheck on left ankle.    HPI  Joshua Hale is a 46 y.o. male who has left ankle pain.  He has been to PT and is making slow progress.  He has no new injury.  I told him I could not give more narcotics. HPI  Body mass index is 20.83 kg/m.  ROS  Review of Systems  Constitutional:       Patient does not have Diabetes Mellitus. Patient does not have hypertension. Patient has COPD or shortness of breath. Patient does not have BMI > 35. Patient has current smoking history.  HENT: Negative for congestion.   Respiratory: Positive for cough and shortness of breath.   Endocrine: Positive for cold intolerance.  Musculoskeletal: Positive for arthralgias, gait problem, joint swelling and myalgias.  Allergic/Immunologic: Positive for environmental allergies.    Past Medical History:  Diagnosis Date  . Crohn disease ([place])   . Post traumatic stress disorder (PTSD)     Past Surgical History:  Procedure Laterality Date  . ABDOMINAL SURGERY     stents  . CHOLECYSTECTOMY    . KNEE SURGERY    . thumb surgery      Family History  Problem Relation Age of Onset  . Hypertension Mother   . Heart failure Mother   . COPD Mother     Social History Social History  Substance Use Topics  . Smoking status: Current Every Day Smoker    Packs/day: 1.00  . Smokeless tobacco: Never Used  . Alcohol use No    Allergies  Allergen Reactions  . Baclofen Swelling  . Fentanyl Hives  . Onion Swelling  . Ultram [Tramadol] Nausea Only    Current Outpatient Prescriptions  Medication Sig Dispense Refill  . diazepam (VALIUM) 10 MG tablet Take 10 mg by mouth 4 (four) times daily. And take 2 tablets at bedtime     . diclofenac (VOLTAREN) 50 MG EC tablet Take 1 tablet (50 mg total) by mouth 2 (two) times daily. 15 tablet 0  . gabapentin (NEURONTIN) 800 MG tablet Take 800 mg  by mouth 2 (two) times daily.      Marland Kitchen HYDROcodone-acetaminophen (NORCO/VICODIN) 5-325 MG tablet Take 1 tablet by mouth every 6 (six) hours as needed for moderate pain (Must last 14 days.Do not take and drive a car or use machinery.). 20 tablet 0  . mirtazapine (REMERON) 30 MG tablet Take 30 mg by mouth at bedtime.      . naproxen sodium (ANAPROX) 220 MG tablet Take 220 mg by mouth 2 (two) times daily with a meal.     No current facility-administered medications for this visit.      Physical Exam  Blood pressure 119/78, pulse 98, temperature 97.7 F (36.5 C), resp. rate 18, height 5\' 7"  (1.702 m), weight 133 lb (60.3 kg).  Constitutional: overall normal hygiene, normal nutrition, well developed, normal grooming, normal body habitus. Assistive device:cane  Musculoskeletal: gait and station Limp left, muscle tone and strength are normal, no tremors or atrophy is present.  .  Neurological: coordination overall normal.  Deep tendon reflex/nerve stretch intact.  Sensation normal.  Cranial nerves II-XII intact.   Skin:   Normal overall no scars, lesions, ulcers or rashes. No psoriasis.  Psychiatric: Alert and oriented x 3.  Recent memory intact, remote memory unclear.  Normal mood and affect. Well groomed.  Good eye  contact.  Cardiovascular: overall no swelling, no varicosities, no edema bilaterally, normal temperatures of the legs and arms, no clubbing, cyanosis and good capillary refill.  Lymphatic: palpation is normal.  Left ankle is tender but has little swelling and good ROM.  He has pain.  He has no distal swelling.  NV intact.   The patient has been educated about the nature of the problem(s) and counseled on treatment options.  The patient appeared to understand what I have discussed and is in agreement with it.  Encounter Diagnoses  Name Primary?  . Acute left ankle pain Yes  . Cigarette nicotine dependence without complication     PLAN Call if any problems.  Precautions  discussed.  Continue current medications.   Return to clinic 2 weeks   Electronically Signed Sanjuana Kava, MD 5/9/20184:02 PM

## 2016-08-21 ENCOUNTER — Ambulatory Visit: Payer: Medicare Other | Admitting: Orthopaedic Surgery

## 2016-08-25 ENCOUNTER — Ambulatory Visit (HOSPITAL_COMMUNITY): Payer: Medicare Other

## 2016-08-27 ENCOUNTER — Ambulatory Visit (HOSPITAL_COMMUNITY): Payer: Medicare Other | Admitting: Physical Therapy

## 2016-08-28 ENCOUNTER — Telehealth (HOSPITAL_COMMUNITY): Payer: Self-pay | Admitting: Physical Therapy

## 2016-08-28 NOTE — Telephone Encounter (Signed)
Pt did not show for 5/16 appointment (1st NS).  Called and left voice mail concerning missed appointment and reminder of next one Friday, 5/18 at 2:30.  Requested patient call clinic if unable to make this appointment.  Teena Irani, PTA/CLT [phone]

## 2016-08-29 ENCOUNTER — Ambulatory Visit (HOSPITAL_COMMUNITY): Payer: Medicare Other

## 2016-08-29 ENCOUNTER — Telehealth (HOSPITAL_COMMUNITY): Payer: Self-pay

## 2016-08-29 NOTE — Telephone Encounter (Signed)
Pt's wife states apptment are changed without her approval and that she has other people in her household that has other MD apptments not just her husband. I let her know we needed to change 5-23 to 5-25 and she said I told that lady I would take care of that when I got there, b/c of other family members I have to take care of. NF 08/29/16

## 2016-09-01 ENCOUNTER — Ambulatory Visit (HOSPITAL_COMMUNITY): Payer: Medicare Other | Admitting: Physical Therapy

## 2016-09-02 ENCOUNTER — Ambulatory Visit (HOSPITAL_COMMUNITY): Payer: Medicare Other

## 2016-09-02 DIAGNOSIS — R262 Difficulty in walking, not elsewhere classified: Secondary | ICD-10-CM | POA: Diagnosis not present

## 2016-09-02 DIAGNOSIS — M6281 Muscle weakness (generalized): Secondary | ICD-10-CM | POA: Diagnosis not present

## 2016-09-02 DIAGNOSIS — M25672 Stiffness of left ankle, not elsewhere classified: Secondary | ICD-10-CM | POA: Diagnosis not present

## 2016-09-02 DIAGNOSIS — R600 Localized edema: Secondary | ICD-10-CM | POA: Diagnosis not present

## 2016-09-02 DIAGNOSIS — R296 Repeated falls: Secondary | ICD-10-CM

## 2016-09-02 NOTE — Therapy (Signed)
[place], [place], [address] Phone: [phone]   Fax:  [phone]  Physical Therapy Treatment  Patient Details  Name: Joshua Hale MRN: [id number] Date of Birth: March 31, 1971 Referring Provider: Sanjuana Kava   Encounter Date: 09/02/2016      PT End of Session - 09/02/16 0830    Visit Number 3   Number of Visits 17   Date for PT Re-Evaluation 09/09/16   Authorization Type Medicare (will need G-codes and KX)   Authorization Time Period 08/12/16 to 10/12/16   Authorization - Visit Number 3   Authorization - Number of Visits 10   PT Start Time 0816   PT Stop Time 0857   PT Time Calculation (min) 41 min   Activity Tolerance Patient tolerated treatment well;Patient limited by lethargy   Behavior During Therapy [facility] for tasks assessed/performed      Past Medical History:  Diagnosis Date  . Crohn disease ([place])   . Post traumatic stress disorder (PTSD)     Past Surgical History:  Procedure Laterality Date  . ABDOMINAL SURGERY     stents  . CHOLECYSTECTOMY    . KNEE SURGERY    . thumb surgery      There were no vitals filed for this visit.      Subjective Assessment - 09/02/16 0824    Subjective Pt. entered dept wearing tennis shoes, reports he has been working on his vehicle for the last 2 weeks and hasn't had time to address HEP.  Reports current Lt ankle pain scale 6/10.  Reports swelling has reduced significantly   Pertinent History hx of heel fx L 1.5 years ago, Chrons, PTSD, x10 surgeries L knee, "I have a hole in my R lung", thumb replacement L    Patient Stated Goals use foot normally again, walk normal, play instruments    Currently in Pain? Yes   Pain Score 6    Pain Location Ankle   Pain Orientation Left   Pain Descriptors / Indicators Dull   Pain Type Chronic pain   Pain Radiating Towards none   Pain Onset 1 to 4 weeks ago   Pain Frequency Constant   Aggravating Factors  standing and  walking on it    Pain Relieving Factors rubbing and massage it   Effect of Pain on Daily Activities severe impact              [facility] Adult PT Treatment/Exercise - 09/02/16 0001      Ambulation/Gait   Ambulation Distance (Feet) 226 Feet   Assistive device None   Gait Comments Cueing for heel to toe pattern, equal stance phase and stride length     Ankle Exercises: Standing   Other Standing Ankle Exercises weight shifting      Ankle Exercises: Seated   ABC's 2 reps   Ankle Circles/Pumps 10 reps   Ankle Circles/Pumps Limitations each direction   Heel Raises 15 reps   Toe Raise 15 reps     Ankle Exercises: Stretches   Slant Board Stretch 3 reps;30 seconds                  PT Short Term Goals - 08/12/16 1530      PT SHORT TERM GOAL #1   Title Patient to demonstrate L ankle ROM as having had improved by 10 degrees all measured directions in order to reduce pain and improve mechanics    Time 4   Period Weeks  Status New     PT SHORT TERM GOAL #2   Title Patient to experience L ankle pain no more than 4/10 in order to improve QOL and functional task tolerance    Time 4   Period Weeks   Status New     PT SHORT TERM GOAL #3   Title Patient to be able to ambulate with LRAD with minimal unsteadiness and pain no more than 4/10 L foot/ankle in order to improve general mobility    Time 4   Period Weeks   Status New     PT SHORT TERM GOAL #4   Title Patient to be able to verbalize importance of supportive footwear/bracing for L foot/ankle in order to prevent further ankle/foot injury    Time 4   Period Weeks   Status New           PT Long Term Goals - 08/12/16 1534      PT LONG TERM GOAL #1   Title Patient to improve strength in all tested groups by at least one MMT grade in order to assist in improving overall strength and balance    Time 8   Period Weeks   Status New     PT LONG TERM GOAL #2   Title Patient to be able to complete TUG test with  LRAD in 15 seconds or less and be able to perform SLS for at least 10 seconds with no HHA in order to show imrpoved balance and reduced fall risk    Time 8   Period Weeks   Status New     PT LONG TERM GOAL #3   Title Patient to report he has been able to tolerate playing his instruments with L ankle/foot pain no more than 2/10 in order to improve QOL    Time 8   Period Weeks   Status New     PT LONG TERM GOAL #4   Title Patient to demonstrate reduced favoring of L LE during all functional tasks in order to assist in building muscular strength, reduce compensations, and to reduce falls related to LE weakness moving forward    Time 8   Period Weeks   Status New               Plan - 09/02/16 2440    Clinical Impression Statement Pt arrived wearing tennis shoes, began session with gait training to improve weight bearing and gait mechanics.  Session foucs on improving ankile mobilty and intrinsic strengthening.  Pt c/o knee pain with all ankle movements due to previous injury, therapist assured minimal compensation with ankle activities.  No edema present today.  Added ABC and BAPS board to improve ankle mobilty, no reports of increased pain through session.     Rehab Potential Fair   Clinical Impairments Affecting Rehab Potential (+) motivated to participate in PT; (-) multiple injuries/surgeries to L LE, chronic L LE disuse/atrophy, chronicity of L ankle/foot problems   PT Frequency 2x / week   PT Duration 8 weeks   PT Treatment/Interventions ADLs/Self Care Home Management;Biofeedback;Cryotherapy;Electrical Stimulation;Moist Heat;Ultrasound;DME Instruction;Gait training;Stair training;Functional mobility training;Therapeutic activities;Therapeutic exercise;Balance training;Neuromuscular re-education;Patient/family education;Manual techniques;Passive range of motion;Dry needling;Taping   PT Next Visit Plan Next session begin rocker board, standing heel and toe raises.  Manual for edema  and ankle ROM PRN.     PT Home Exercise Plan Eval: ankle circles, gastroc stretch with extended hold, gastoc/achilles massage       Patient will benefit from skilled  therapeutic intervention in order to improve the following deficits and impairments:  Abnormal gait, Increased fascial restricitons, Improper body mechanics, Pain, Decreased coordination, Decreased mobility, Postural dysfunction, Decreased activity tolerance, Decreased range of motion, Decreased strength, Hypomobility, Decreased balance, Difficulty walking, Increased edema, Impaired flexibility, Decreased safety awareness  Visit Diagnosis: Stiffness of left ankle, not elsewhere classified  Difficulty in walking, not elsewhere classified  Localized edema  Repeated falls  Muscle weakness (generalized)     Problem List Patient Active Problem List   Diagnosis Date Noted  . Heel fracture 05/22/2015  . SHOULDER INSTABILITY 03/22/2008  . SHOULDER PAIN 03/22/2008   Ihor Austin, [place]; [place]  Aldona Lento 09/02/2016, 12:12 PM  [place], [place], [address] Phone: [phone]   Fax:  [phone]  Name: EMMERICH CRYER MRN: [id number] Date of Birth: 1970/07/02

## 2016-09-03 ENCOUNTER — Encounter (HOSPITAL_COMMUNITY): Payer: Medicare Other

## 2016-09-04 ENCOUNTER — Ambulatory Visit: Payer: Medicare Other | Admitting: Orthopaedic Surgery

## 2016-09-05 ENCOUNTER — Encounter (HOSPITAL_COMMUNITY): Payer: Medicare Other

## 2016-09-09 ENCOUNTER — Ambulatory Visit (HOSPITAL_COMMUNITY): Payer: Medicare Other | Admitting: Physical Therapy

## 2016-09-11 ENCOUNTER — Ambulatory Visit (HOSPITAL_COMMUNITY): Payer: Medicare Other | Admitting: Physical Therapy

## 2016-09-18 ENCOUNTER — Ambulatory Visit (HOSPITAL_COMMUNITY): Payer: Medicare Other | Attending: Orthopaedic Surgery | Admitting: Physical Therapy

## 2016-09-18 DIAGNOSIS — R600 Localized edema: Secondary | ICD-10-CM

## 2016-09-18 DIAGNOSIS — R262 Difficulty in walking, not elsewhere classified: Secondary | ICD-10-CM

## 2016-09-18 DIAGNOSIS — R296 Repeated falls: Secondary | ICD-10-CM

## 2016-09-18 DIAGNOSIS — M25672 Stiffness of left ankle, not elsewhere classified: Secondary | ICD-10-CM | POA: Diagnosis not present

## 2016-09-18 DIAGNOSIS — M6281 Muscle weakness (generalized): Secondary | ICD-10-CM | POA: Diagnosis not present

## 2016-09-18 NOTE — Therapy (Signed)
[facility] [address] [place], [place], [address] Phone: [phone]   Fax:  [phone]  Physical Therapy Treatment  Patient Details  Name: Joshua Hale MRN: [id number] Date of Birth: 08-12-1970 Referring Provider: Darreld Mclean   Encounter Date: 09/18/2016      PT End of Session - 09/18/16 1506    Visit Number 4   Number of Visits 4   Date for PT Re-Evaluation 09/09/16   Authorization Type Medicare (will need G-codes and KX)   Authorization Time Period 08/12/16 to 10/12/16   Authorization - Visit Number 4   Authorization - Number of Visits 4   PT Start Time 1430   PT Stop Time 1506   PT Time Calculation (min) 36 min   Activity Tolerance Patient tolerated treatment well;Patient limited by lethargy   Behavior During Therapy [facility] for tasks assessed/performed      Past Medical History:  Diagnosis Date  . Crohn disease ([facility])   . Post traumatic stress disorder (PTSD)     Past Surgical History:  Procedure Laterality Date  . ABDOMINAL SURGERY     stents  . CHOLECYSTECTOMY    . KNEE SURGERY    . thumb surgery      There were no vitals filed for this visit.      Subjective Assessment - 09/18/16 1430    Subjective Pt has not been to treatment since 09/02/2016; states that he has been having car problems . He states that he can play the drums again and that is all he really wanted.    Pertinent History hx of heel fx L 1.5 years ago, Chrons, PTSD, x10 surgeries L knee, "I have a hole in my R lung", thumb replacement L    Limitations Standing;Walking;Lifting;House hold activities   How long can you sit comfortably? no problem    How long can you stand comfortably? limited due to his left knee    How long can you walk comfortably? limited due to his left knee not his ankle    Patient Stated Goals use foot normally again, walk normal, play instruments    Currently in Pain? Yes   Pain Score 1    Pain Location Ankle   Pain  Orientation Left   Pain Descriptors / Indicators Aching   Pain Type Chronic pain   Pain Onset 1 to 4 weeks ago   Pain Frequency Intermittent   Aggravating Factors  activity   Pain Relieving Factors tylenol   Effect of Pain on Daily Activities increases             [facility] PT Assessment - 09/18/16 0001      Assessment   Medical Diagnosis L ankle pain    Referring Provider Darreld Mclean    Onset Date/Surgical Date 07/13/16   Next MD Visit Dr. Hilda Lias in late May    Prior Therapy none      Precautions   Precautions Fall;Other (comment)  CAM boot      Balance Screen   Has the patient fallen in the past 6 months Yes   How many times? --  falls everyday    Has the patient had a decrease in activity level because of a fear of falling?  Yes   Is the patient reluctant to leave their home because of a fear of falling?  No     Prior Function   Level of Independence Independent;Independent with basic ADLs;Independent with gait;Independent with transfers   Vocation  On disability;Retired   Leisure Corporate treasurer, listening to music      Observation/Other Assessments   Observations sores noted L LE however patient states this is due to medication/MD aware; gross atrophy      AROM   Right Ankle Dorsiflexion 16   Right Ankle Plantar Flexion --  WFL    Right Ankle Inversion 35  hip compensation    Right Ankle Eversion 16   Left Ankle Dorsiflexion -2  was -8   Left Ankle Plantar Flexion 60  was 50   Left Ankle Inversion 30  was 23   Left Ankle Eversion 10  was 2     Strength   Right Knee Flexion 5/5   Right Knee Extension 5/5   Left Knee Flexion 4/5  was 3+   Left Knee Extension 4/5  Was 4-   Right Ankle Dorsiflexion 5/5   Right Ankle Inversion 5/5   Right Ankle Eversion 5/5   Left Ankle Dorsiflexion 3+/5  was 3-   Left Ankle Inversion 3/5   Left Ankle Eversion 3/5  ROM limited      Ambulation/Gait   Gait Comments reduced gait speed, reduced toe off/push  off, step through pattern but antalgic, B crutches                      [facility] Adult PT Treatment/Exercise - 09/18/16 0001      Ankle Exercises: Supine   T-Band all x 10     Ankle Exercises: Standing   SLS x3                PT Education - 09/18/16 1451    Education provided Yes   Education Details HEP   Person(s) Educated Patient   Methods Explanation   Comprehension Verbalized understanding          PT Short Term Goals - 09/18/16 1452      PT SHORT TERM GOAL #1   Title Patient to demonstrate L ankle ROM as having had improved by 10 degrees all measured directions in order to reduce pain and improve mechanics    Time 4   Period Weeks   Status Partially Met     PT SHORT TERM GOAL #2   Title Patient to experience L ankle pain no more than 4/10 in order to improve QOL and functional task tolerance    Time 4   Period Weeks   Status Achieved     PT SHORT TERM GOAL #3   Title Patient to be able to ambulate with LRAD with minimal unsteadiness and pain no more than 4/10 L foot/ankle in order to improve general mobility    Time 4   Period Weeks   Status Achieved     PT SHORT TERM GOAL #4   Title Patient to be able to verbalize importance of supportive footwear/bracing for L foot/ankle in order to prevent further ankle/foot injury    Time 4   Period Weeks   Status Achieved           PT Long Term Goals - 09/18/16 1453      PT LONG TERM GOAL #1   Title Patient to improve strength in all tested groups by at least one MMT grade in order to assist in improving overall strength and balance    Time 8   Period Weeks   Status Partially Met     PT LONG TERM GOAL #2   Title Patient to be able  to complete TUG test with LRAD in 15 seconds or less and be able to perform SLS for at least 10 seconds with no HHA in order to show imrpoved balance and reduced fall risk   LSL RT 60" ; LT 23 TUG9.12   Time 8   Period Weeks   Status Achieved     PT LONG TERM  GOAL #3   Title Patient to report he has been able to tolerate playing his instruments with L ankle/foot pain no more than 2/10 in order to improve QOL    Time 8   Period Weeks   Status Achieved     PT LONG TERM GOAL #4   Title Patient to demonstrate reduced favoring of L LE during all functional tasks in order to assist in building muscular strength, reduce compensations, and to reduce falls related to LE weakness moving forward    Time 8   Period Weeks   Status Achieved               Plan - 2016-09-26 1507    Clinical Impression Statement Pt has been to four sessions of therapy since his evaluation on 08/12/2016.  His reassessment demonstrates significant improvement in all areas.   Pt goal was to play the drums and he is able to do this activity.  Pt is ready for discharge    Rehab Potential Fair   Clinical Impairments Affecting Rehab Potential (+) motivated to participate in PT; (-) multiple injuries/surgeries to L LE, chronic L LE disuse/atrophy, chronicity of L ankle/foot problems   PT Frequency 2x / week   PT Duration 8 weeks   PT Treatment/Interventions ADLs/Self Care Home Management;Biofeedback;Cryotherapy;Electrical Stimulation;Moist Heat;Ultrasound;DME Instruction;Gait training;Stair training;Functional mobility training;Therapeutic activities;Therapeutic exercise;Balance training;Neuromuscular re-education;Patient/family education;Manual techniques;Passive range of motion;Dry needling;Taping   PT Next Visit Plan Discharge to HEP    PT Home Exercise Plan Eval: ankle circles, gastroc stretch with extended hold, gastoc/achilles massage ; T-band for all motions    Consulted and Agree with Plan of Care Patient      Patient will benefit from skilled therapeutic intervention in order to improve the following deficits and impairments:  Abnormal gait, Increased fascial restricitons, Improper body mechanics, Pain, Decreased coordination, Decreased mobility, Postural dysfunction,  Decreased activity tolerance, Decreased range of motion, Decreased strength, Hypomobility, Decreased balance, Difficulty walking, Increased edema, Impaired flexibility, Decreased safety awareness  Visit Diagnosis: Stiffness of left ankle, not elsewhere classified  Difficulty in walking, not elsewhere classified  Localized edema  Repeated falls  Muscle weakness (generalized)       G-Codes - 09-26-16 1509    Functional Assessment Tool Used (Outpatient Only) Clinical assessment of ROM, Balance, strength and gait    Functional Limitation Mobility: Walking and moving around   Mobility: Walking and Moving Around Goal Status [id number]) At least 40 percent but less than 60 percent impaired, limited or restricted   Mobility: Walking and Moving Around Discharge Status [phone]) At least 20 percent but less than 40 percent impaired, limited or restricted      Problem List Patient Active Problem List   Diagnosis Date Noted  . Heel fracture 05/22/2015  . SHOULDER INSTABILITY 03/22/2008  . SHOULDER PAIN 03/22/2008    Rayetta Humphrey, PT CLT [phone] 2016-09-26, 3:10 PM  [facility] [address] [place], [place], [address] Phone: [phone]   Fax:  [phone]  Name: Joshua Hale MRN: [id number] Date of Birth: February 13, 1971  PHYSICAL THERAPY DISCHARGE SUMMARY  Visits from [facility]  of Care: 4  Current functional level related to goals / functional outcomes: See above   Remaining deficits: See above   Education / Equipment: HEP  Plan: Patient agrees to discharge.  Patient goals were met. Patient is being discharged due to meeting the stated rehab goals.  ?????        Rayetta Humphrey, [place] CLT [phone]

## 2016-09-18 NOTE — Patient Instructions (Addendum)
Dorsiflexion: Resisted    Facing anchor, tubing around left foot, pull toward face.  Repeat __10__ times per set. Do _1___ sets per session. Do _2___ sessions per day.  http://orth.exer.us/8   Copyright  VHI. All rights reserved.  Eversion: Resisted    With right foot in tubing loop, hold tubing around other foot to resist and turn foot out. Repeat ___10_ times per set. Do _1___ sets per session. Do _2___ sessions per day.  http://orth.exer.us/14   Copyright  VHI. All rights reserved.  Inversion: Resisted    Cross legs with right leg underneath, foot in tubing loop. Hold tubing around other foot to resist and turn foot in. Repeat _10___ times per set. Do 1____ sets per session. Do _2___ sessions per day.  http://orth.exer.us/12   Copyright  VHI. All rights reserved.  Plantar Flexion: Resisted    Anchor behind, tubing around left foot, press down. Repeat _10___ times per set. Do _1___ sets per session. Do __2__ sessions per day.  http://orth.exer.us/10   Copyright  VHI. All rights reserved.

## 2016-09-25 ENCOUNTER — Ambulatory Visit (INDEPENDENT_AMBULATORY_CARE_PROVIDER_SITE_OTHER): Payer: Medicare Other | Admitting: Orthopaedic Surgery

## 2016-09-25 ENCOUNTER — Encounter: Payer: Self-pay | Admitting: Orthopaedic Surgery

## 2016-09-25 VITALS — BP 123/79 | HR 92 | Temp 97.5°F | Ht 67.0 in | Wt 132.0 lb

## 2016-09-25 DIAGNOSIS — G8929 Other chronic pain: Secondary | ICD-10-CM | POA: Diagnosis not present

## 2016-09-25 DIAGNOSIS — M25562 Pain in left knee: Secondary | ICD-10-CM | POA: Diagnosis not present

## 2016-09-25 DIAGNOSIS — F17210 Nicotine dependence, cigarettes, uncomplicated: Secondary | ICD-10-CM

## 2016-09-25 NOTE — Progress Notes (Signed)
Patient ZS:WFUX Joshua Hale, male DOB:1970/07/30, 46 y.o. NAT:[id number]  Chief Complaint  Patient presents with  . Follow-up    LEFT KNEE PAIN    HPI  Joshua Hale is a 46 y.o. male who has chronic pain of the left knee.  He has been to PT.  He is better.  He has less pain.  He is walking better. He has no swelling, no giving away. HPI  Body mass index is 20.67 kg/m.  ROS  Review of Systems  Constitutional:       Patient does not have Diabetes Mellitus. Patient does not have hypertension. Patient has COPD or shortness of breath. Patient does not have BMI > 35. Patient has current smoking history.  HENT: Negative for congestion.   Respiratory: Positive for cough and shortness of breath.   Endocrine: Positive for cold intolerance.  Musculoskeletal: Positive for arthralgias, gait problem, joint swelling and myalgias.  Allergic/Immunologic: Positive for environmental allergies.    Past Medical History:  Diagnosis Date  . Crohn disease ([place])   . Post traumatic stress disorder (PTSD)     Past Surgical History:  Procedure Laterality Date  . ABDOMINAL SURGERY     stents  . CHOLECYSTECTOMY    . KNEE SURGERY    . thumb surgery      Family History  Problem Relation Age of Onset  . Hypertension Mother   . Heart failure Mother   . COPD Mother     Social History Social History  Substance Use Topics  . Smoking status: Current Every Day Smoker    Packs/day: 1.00  . Smokeless tobacco: Never Used  . Alcohol use No    Allergies  Allergen Reactions  . Baclofen Swelling  . Fentanyl Hives  . Onion Swelling  . Ultram [Tramadol] Nausea Only    Current Outpatient Prescriptions  Medication Sig Dispense Refill  . diazepam (VALIUM) 10 MG tablet Take 10 mg by mouth 4 (four) times daily. And take 2 tablets at bedtime     . diclofenac (VOLTAREN) 50 MG EC tablet Take 1 tablet (50 mg total) by mouth 2 (two) times daily. 15 tablet 0  . gabapentin (NEURONTIN) 800 MG tablet Take 800  mg by mouth 2 (two) times daily.      Marland Kitchen HYDROcodone-acetaminophen (NORCO/VICODIN) 5-325 MG tablet Take 1 tablet by mouth every 6 (six) hours as needed for moderate pain (Must last 14 days.Do not take and drive a car or use machinery.). 20 tablet 0  . mirtazapine (REMERON) 30 MG tablet Take 30 mg by mouth at bedtime.      . naproxen sodium (ANAPROX) 220 MG tablet Take 220 mg by mouth 2 (two) times daily with a meal.     No current facility-administered medications for this visit.      Physical Exam  Blood pressure 123/79, pulse 92, temperature 97.5 F (36.4 C), height 5\' 7"  (1.702 m), weight 132 lb (59.9 kg).  Constitutional: overall normal hygiene, normal nutrition, well developed, normal grooming, normal body habitus. Assistive device:none  Musculoskeletal: gait and station Limp left, muscle tone and strength are normal, no tremors or atrophy is present.  .  Neurological: coordination overall normal.  Deep tendon reflex/nerve stretch intact.  Sensation normal.  Cranial nerves II-XII intact.   Skin:   Normal overall knee left scars, no lesions, ulcers or rashes. No psoriasis.  Psychiatric: Alert and oriented x 3.  Recent memory intact, remote memory unclear.  Normal mood and affect. Well groomed.  Good  eye contact.  Cardiovascular: overall no swelling, no varicosities, no edema bilaterally, normal temperatures of the legs and arms, no clubbing, cyanosis and good capillary refill.  Lymphatic: palpation is normal.  His left knee has well healed scars.  He has no effusion today. ROM is full. He has crepitus.  He has no weakness.  NV intact.  The patient has been educated about the nature of the problem(s) and counseled on treatment options.  The patient appeared to understand what I have discussed and is in agreement with it.  Encounter Diagnoses  Name Primary?  . Chronic pain of left knee Yes  . Cigarette nicotine dependence without complication     PLAN Call if any problems.   Precautions discussed.  Continue current medications.   Return to clinic 2 months  Electronically Signed Sanjuana Kava, MD 6/14/201810:34 AM

## 2016-09-25 NOTE — Patient Instructions (Signed)
Steps to Quit Smoking Smoking tobacco can be bad for your health. It can also affect almost every organ in your body. Smoking puts you and people around you at risk for many serious long-lasting (chronic) diseases. Quitting smoking is hard, but it is one of the best things that you can do for your health. It is never too late to quit. What are the benefits of quitting smoking? When you quit smoking, you lower your risk for getting serious diseases and conditions. They can include:  Lung cancer or lung disease.  Heart disease.  Stroke.  Heart attack.  Not being able to have children (infertility).  Weak bones (osteoporosis) and broken bones (fractures).  If you have coughing, wheezing, and shortness of breath, those symptoms may get better when you quit. You may also get sick less often. If you are pregnant, quitting smoking can help to lower your chances of having a baby of low birth weight. What can I do to help me quit smoking? Talk with your doctor about what can help you quit smoking. Some things you can do (strategies) include:  Quitting smoking totally, instead of slowly cutting back how much you smoke over a period of time.  Going to in-person counseling. You are more likely to quit if you go to many counseling sessions.  Using resources and support systems, such as: ? Online chats with a counselor. ? Phone quitlines. ? Printed self-help materials. ? Support groups or group counseling. ? Text messaging programs. ? Mobile phone apps or applications.  Taking medicines. Some of these medicines may have nicotine in them. If you are pregnant or breastfeeding, do not take any medicines to quit smoking unless your doctor says it is okay. Talk with your doctor about counseling or other things that can help you.  Talk with your doctor about using more than one strategy at the same time, such as taking medicines while you are also going to in-person counseling. This can help make  quitting easier. What things can I do to make it easier to quit? Quitting smoking might feel very hard at first, but there is a lot that you can do to make it easier. Take these steps:  Talk to your family and friends. Ask them to support and encourage you.  Call phone quitlines, reach out to support groups, or work with a counselor.  Ask people who smoke to not smoke around you.  Avoid places that make you want (trigger) to smoke, such as: ? Bars. ? Parties. ? Smoke-break areas at work.  Spend time with people who do not smoke.  Lower the stress in your life. Stress can make you want to smoke. Try these things to help your stress: ? Getting regular exercise. ? Deep-breathing exercises. ? Yoga. ? Meditating. ? Doing a body scan. To do this, close your eyes, focus on one area of your body at a time from head to toe, and notice which parts of your body are tense. Try to relax the muscles in those areas.  Download or buy apps on your mobile phone or tablet that can help you stick to your quit plan. There are many free apps, such as QuitGuide from the CDC (Centers for Disease Control and Prevention). You can find more support from smokefree.gov and other websites.  This information is not intended to replace advice given to you by your health care provider. Make sure you discuss any questions you have with your health care provider. Document Released: 01/25/2009 Document   Revised: 11/27/2015 Document Reviewed: 08/15/2014 Elsevier Interactive Patient Education  2018 Elsevier Inc.  

## 2016-09-30 ENCOUNTER — Ambulatory Visit (HOSPITAL_COMMUNITY): Payer: Medicare Other

## 2016-10-02 DIAGNOSIS — M545 Low back pain: Secondary | ICD-10-CM | POA: Diagnosis not present

## 2016-10-02 DIAGNOSIS — I10 Essential (primary) hypertension: Secondary | ICD-10-CM | POA: Diagnosis not present

## 2016-10-02 DIAGNOSIS — F411 Generalized anxiety disorder: Secondary | ICD-10-CM | POA: Diagnosis not present

## 2016-10-03 ENCOUNTER — Encounter (HOSPITAL_COMMUNITY): Payer: Medicare Other

## 2016-10-07 ENCOUNTER — Encounter (HOSPITAL_COMMUNITY): Payer: Medicare Other | Admitting: Physical Therapy

## 2016-10-09 ENCOUNTER — Encounter (HOSPITAL_COMMUNITY): Payer: Medicare Other

## 2016-10-14 ENCOUNTER — Encounter (HOSPITAL_COMMUNITY): Payer: Medicare Other | Admitting: Physical Therapy

## 2016-10-16 ENCOUNTER — Encounter (HOSPITAL_COMMUNITY): Payer: Medicare Other | Admitting: Physical Therapy

## 2016-10-21 ENCOUNTER — Encounter (HOSPITAL_COMMUNITY): Payer: Medicare Other

## 2016-10-23 ENCOUNTER — Encounter (HOSPITAL_COMMUNITY): Payer: Medicare Other | Admitting: Physical Therapy

## 2016-10-28 ENCOUNTER — Encounter (HOSPITAL_COMMUNITY): Payer: Medicare Other | Admitting: Physical Therapy

## 2016-10-30 ENCOUNTER — Encounter (HOSPITAL_COMMUNITY): Payer: Medicare Other | Admitting: Physical Therapy

## 2016-11-25 ENCOUNTER — Ambulatory Visit: Payer: Medicare Other | Admitting: Orthopaedic Surgery

## 2016-12-08 DIAGNOSIS — I10 Essential (primary) hypertension: Secondary | ICD-10-CM | POA: Diagnosis not present

## 2016-12-08 DIAGNOSIS — F411 Generalized anxiety disorder: Secondary | ICD-10-CM | POA: Diagnosis not present

## 2016-12-08 DIAGNOSIS — M545 Low back pain: Secondary | ICD-10-CM | POA: Diagnosis not present

## 2016-12-10 ENCOUNTER — Ambulatory Visit (INDEPENDENT_AMBULATORY_CARE_PROVIDER_SITE_OTHER): Payer: Medicare Other

## 2016-12-10 ENCOUNTER — Ambulatory Visit (INDEPENDENT_AMBULATORY_CARE_PROVIDER_SITE_OTHER): Payer: Medicare Other | Admitting: Orthopaedic Surgery

## 2016-12-10 VITALS — BP 115/77 | HR 99 | Temp 97.2°F | Ht 67.0 in | Wt 129.0 lb

## 2016-12-10 DIAGNOSIS — M25562 Pain in left knee: Secondary | ICD-10-CM

## 2016-12-10 DIAGNOSIS — G8929 Other chronic pain: Secondary | ICD-10-CM

## 2016-12-10 NOTE — Progress Notes (Signed)
Patient EL:FYBO Joshua Hale, male DOB:02/06/1971, 46 y.o. FBP:[id number]  Chief Complaint  Patient presents with  . Follow-up    Left knee pain    HPI  Joshua Hale is a 45 y.o. male who has pain of the left knee.  He fell on it last night and hurt the mid left knee.  He has increased pain.  He has no giving way or locking. HPI  Body mass index is 20.2 kg/m.  ROS  Review of Systems  Constitutional:       Patient does not have Diabetes Mellitus. Patient does not have hypertension. Patient has COPD or shortness of breath. Patient does not have BMI > 35. Patient has current smoking history.  HENT: Negative for congestion.   Respiratory: Positive for cough and shortness of breath.   Endocrine: Positive for cold intolerance.  Musculoskeletal: Positive for arthralgias, gait problem, joint swelling and myalgias.  Allergic/Immunologic: Positive for environmental allergies.    Past Medical History:  Diagnosis Date  . Crohn disease ([place])   . Post traumatic stress disorder (PTSD)     Past Surgical History:  Procedure Laterality Date  . ABDOMINAL SURGERY     stents  . CHOLECYSTECTOMY    . KNEE SURGERY    . thumb surgery      Family History  Problem Relation Age of Onset  . Hypertension Mother   . Heart failure Mother   . COPD Mother     Social History Social History  Substance Use Topics  . Smoking status: Current Every Day Smoker    Packs/day: 1.00  . Smokeless tobacco: Never Used  . Alcohol use No    Allergies  Allergen Reactions  . Baclofen Swelling  . Fentanyl Hives  . Onion Swelling  . Ultram [Tramadol] Nausea Only    Current Outpatient Prescriptions  Medication Sig Dispense Refill  . diazepam (VALIUM) 10 MG tablet Take 10 mg by mouth 4 (four) times daily. And take 2 tablets at bedtime     . diclofenac (VOLTAREN) 50 MG EC tablet Take 1 tablet (50 mg total) by mouth 2 (two) times daily. 15 tablet 0  . gabapentin (NEURONTIN) 800 MG tablet Take 800 mg by  mouth 2 (two) times daily.      Marland Kitchen HYDROcodone-acetaminophen (NORCO/VICODIN) 5-325 MG tablet Take 1 tablet by mouth every 6 (six) hours as needed for moderate pain (Must last 14 days.Do not take and drive a car or use machinery.). 20 tablet 0  . mirtazapine (REMERON) 30 MG tablet Take 30 mg by mouth at bedtime.      . naproxen sodium (ANAPROX) 220 MG tablet Take 220 mg by mouth 2 (two) times daily with a meal.     No current facility-administered medications for this visit.      Physical Exam  Blood pressure 115/77, pulse 99, temperature (!) 97.2 F (36.2 C), height 5\' 7"  (1.702 m), weight 129 lb (58.5 kg).  Constitutional: overall normal hygiene, normal nutrition, well developed, normal grooming, normal body habitus. Assistive device:none  Musculoskeletal: gait and station Limp left, muscle tone and strength are normal, no tremors or atrophy is present.  .  Neurological: coordination overall normal.  Deep tendon reflex/nerve stretch intact.  Sensation normal.  Cranial nerves II-XII intact.   Skin:   Normal overall no scars, lesions, ulcers or rashes. No psoriasis.  Psychiatric: Alert and oriented x 3.  Recent memory intact, remote memory unclear.  Normal mood and affect. Well groomed.  Good eye contact.  Cardiovascular: overall no swelling, no varicosities, no edema bilaterally, normal temperatures of the legs and arms, no clubbing, cyanosis and good capillary refill.  Lymphatic: palpation is normal.  Left knee has prior deformities from prior surgery and scars.  He has prominent tibial tubercle area. ROM is 0 to 100 with pain, he has no effusion, knee stable.  NV intact.  Limp left.  The patient has been educated about the nature of the problem(s) and counseled on treatment options.  The patient appeared to understand what I have discussed and is in agreement with it.  Encounter Diagnosis  Name Primary?  . Chronic pain of left knee Yes   X-rays of the left knee were done,  reported separately.  No new fracture noted.  PLAN Call if any problems.  Precautions discussed.  Continue current medications.   Return to clinic 2 weeks   He has Tramadol just filled two days ago.  Use ice and take this for pain.  Use crutches.  Electronically Signed Sanjuana Kava, MD 8/29/20183:20 PM

## 2016-12-24 ENCOUNTER — Ambulatory Visit: Payer: Medicare Other | Admitting: Orthopaedic Surgery

## 2017-02-09 DIAGNOSIS — I10 Essential (primary) hypertension: Secondary | ICD-10-CM | POA: Diagnosis not present

## 2017-02-09 DIAGNOSIS — F411 Generalized anxiety disorder: Secondary | ICD-10-CM | POA: Diagnosis not present

## 2017-02-09 DIAGNOSIS — M545 Low back pain: Secondary | ICD-10-CM | POA: Diagnosis not present

## 2017-02-23 DIAGNOSIS — R1084 Generalized abdominal pain: Secondary | ICD-10-CM | POA: Diagnosis not present

## 2017-02-23 DIAGNOSIS — Z79899 Other long term (current) drug therapy: Secondary | ICD-10-CM | POA: Diagnosis not present

## 2017-02-23 DIAGNOSIS — F17200 Nicotine dependence, unspecified, uncomplicated: Secondary | ICD-10-CM | POA: Diagnosis not present

## 2017-02-23 DIAGNOSIS — F329 Major depressive disorder, single episode, unspecified: Secondary | ICD-10-CM | POA: Diagnosis not present

## 2017-02-23 DIAGNOSIS — R109 Unspecified abdominal pain: Secondary | ICD-10-CM | POA: Diagnosis not present

## 2017-02-23 DIAGNOSIS — F419 Anxiety disorder, unspecified: Secondary | ICD-10-CM | POA: Diagnosis not present

## 2017-04-10 DIAGNOSIS — M545 Low back pain: Secondary | ICD-10-CM | POA: Diagnosis not present

## 2017-04-10 DIAGNOSIS — F411 Generalized anxiety disorder: Secondary | ICD-10-CM | POA: Diagnosis not present

## 2017-04-10 DIAGNOSIS — I10 Essential (primary) hypertension: Secondary | ICD-10-CM | POA: Diagnosis not present

## 2017-06-07 DIAGNOSIS — Z79899 Other long term (current) drug therapy: Secondary | ICD-10-CM | POA: Diagnosis not present

## 2017-06-07 DIAGNOSIS — F17200 Nicotine dependence, unspecified, uncomplicated: Secondary | ICD-10-CM | POA: Diagnosis not present

## 2017-06-07 DIAGNOSIS — K219 Gastro-esophageal reflux disease without esophagitis: Secondary | ICD-10-CM | POA: Diagnosis not present

## 2017-06-07 DIAGNOSIS — F419 Anxiety disorder, unspecified: Secondary | ICD-10-CM | POA: Diagnosis not present

## 2017-06-07 DIAGNOSIS — N5082 Scrotal pain: Secondary | ICD-10-CM | POA: Diagnosis not present

## 2017-06-07 DIAGNOSIS — L02214 Cutaneous abscess of groin: Secondary | ICD-10-CM | POA: Diagnosis not present

## 2017-06-07 DIAGNOSIS — F329 Major depressive disorder, single episode, unspecified: Secondary | ICD-10-CM | POA: Diagnosis not present

## 2017-06-09 DIAGNOSIS — N492 Inflammatory disorders of scrotum: Secondary | ICD-10-CM | POA: Diagnosis not present

## 2017-06-09 DIAGNOSIS — M545 Low back pain: Secondary | ICD-10-CM | POA: Diagnosis not present

## 2017-06-09 DIAGNOSIS — F411 Generalized anxiety disorder: Secondary | ICD-10-CM | POA: Diagnosis not present

## 2017-06-09 DIAGNOSIS — I10 Essential (primary) hypertension: Secondary | ICD-10-CM | POA: Diagnosis not present

## 2017-08-06 DIAGNOSIS — I10 Essential (primary) hypertension: Secondary | ICD-10-CM | POA: Diagnosis not present

## 2017-08-06 DIAGNOSIS — M545 Low back pain: Secondary | ICD-10-CM | POA: Diagnosis not present

## 2017-08-06 DIAGNOSIS — F411 Generalized anxiety disorder: Secondary | ICD-10-CM | POA: Diagnosis not present

## 2017-09-08 DIAGNOSIS — Z79891 Long term (current) use of opiate analgesic: Secondary | ICD-10-CM | POA: Diagnosis not present

## 2017-09-08 DIAGNOSIS — M792 Neuralgia and neuritis, unspecified: Secondary | ICD-10-CM | POA: Diagnosis not present

## 2017-09-08 DIAGNOSIS — M545 Low back pain: Secondary | ICD-10-CM | POA: Diagnosis not present

## 2017-09-08 DIAGNOSIS — M79605 Pain in left leg: Secondary | ICD-10-CM | POA: Diagnosis not present

## 2017-09-08 DIAGNOSIS — M25562 Pain in left knee: Secondary | ICD-10-CM | POA: Diagnosis not present

## 2017-09-08 DIAGNOSIS — M130 Polyarthritis, unspecified: Secondary | ICD-10-CM | POA: Diagnosis not present

## 2017-10-16 DIAGNOSIS — H6241 Otitis externa in other diseases classified elsewhere, right ear: Secondary | ICD-10-CM | POA: Diagnosis not present

## 2017-10-16 DIAGNOSIS — M545 Low back pain: Secondary | ICD-10-CM | POA: Diagnosis not present

## 2017-10-16 DIAGNOSIS — F411 Generalized anxiety disorder: Secondary | ICD-10-CM | POA: Diagnosis not present

## 2017-10-16 DIAGNOSIS — I10 Essential (primary) hypertension: Secondary | ICD-10-CM | POA: Diagnosis not present

## 2017-10-22 DIAGNOSIS — M79662 Pain in left lower leg: Secondary | ICD-10-CM | POA: Diagnosis not present

## 2017-10-22 DIAGNOSIS — M545 Low back pain: Secondary | ICD-10-CM | POA: Diagnosis not present

## 2017-10-22 DIAGNOSIS — M792 Neuralgia and neuritis, unspecified: Secondary | ICD-10-CM | POA: Diagnosis not present

## 2017-10-22 DIAGNOSIS — M25562 Pain in left knee: Secondary | ICD-10-CM | POA: Diagnosis not present

## 2017-10-22 DIAGNOSIS — Z79891 Long term (current) use of opiate analgesic: Secondary | ICD-10-CM | POA: Diagnosis not present

## 2017-11-20 DIAGNOSIS — M79662 Pain in left lower leg: Secondary | ICD-10-CM | POA: Diagnosis not present

## 2017-11-20 DIAGNOSIS — M545 Low back pain: Secondary | ICD-10-CM | POA: Diagnosis not present

## 2017-11-20 DIAGNOSIS — M792 Neuralgia and neuritis, unspecified: Secondary | ICD-10-CM | POA: Diagnosis not present

## 2017-11-20 DIAGNOSIS — M25562 Pain in left knee: Secondary | ICD-10-CM | POA: Diagnosis not present

## 2017-12-21 DIAGNOSIS — M545 Low back pain: Secondary | ICD-10-CM | POA: Diagnosis not present

## 2017-12-21 DIAGNOSIS — M792 Neuralgia and neuritis, unspecified: Secondary | ICD-10-CM | POA: Diagnosis not present

## 2017-12-21 DIAGNOSIS — M25562 Pain in left knee: Secondary | ICD-10-CM | POA: Diagnosis not present

## 2017-12-21 DIAGNOSIS — M79662 Pain in left lower leg: Secondary | ICD-10-CM | POA: Diagnosis not present

## 2018-01-19 DIAGNOSIS — M792 Neuralgia and neuritis, unspecified: Secondary | ICD-10-CM | POA: Diagnosis not present

## 2018-01-19 DIAGNOSIS — M25562 Pain in left knee: Secondary | ICD-10-CM | POA: Diagnosis not present

## 2018-01-19 DIAGNOSIS — M545 Low back pain: Secondary | ICD-10-CM | POA: Diagnosis not present

## 2018-01-19 DIAGNOSIS — Z79891 Long term (current) use of opiate analgesic: Secondary | ICD-10-CM | POA: Diagnosis not present

## 2018-01-19 DIAGNOSIS — M79662 Pain in left lower leg: Secondary | ICD-10-CM | POA: Diagnosis not present

## 2018-02-03 DIAGNOSIS — Z1389 Encounter for screening for other disorder: Secondary | ICD-10-CM | POA: Diagnosis not present

## 2018-02-03 DIAGNOSIS — M545 Low back pain: Secondary | ICD-10-CM | POA: Diagnosis not present

## 2018-02-03 DIAGNOSIS — Z131 Encounter for screening for diabetes mellitus: Secondary | ICD-10-CM | POA: Diagnosis not present

## 2018-02-03 DIAGNOSIS — H65111 Acute and subacute allergic otitis media (mucoid) (sanguinous) (serous), right ear: Secondary | ICD-10-CM | POA: Diagnosis not present

## 2018-02-03 DIAGNOSIS — G43409 Hemiplegic migraine, not intractable, without status migrainosus: Secondary | ICD-10-CM | POA: Diagnosis not present

## 2018-02-03 DIAGNOSIS — I10 Essential (primary) hypertension: Secondary | ICD-10-CM | POA: Diagnosis not present

## 2018-02-03 DIAGNOSIS — Z0000 Encounter for general adult medical examination without abnormal findings: Secondary | ICD-10-CM | POA: Diagnosis not present

## 2018-02-03 DIAGNOSIS — F411 Generalized anxiety disorder: Secondary | ICD-10-CM | POA: Diagnosis not present

## 2018-02-10 DIAGNOSIS — F17200 Nicotine dependence, unspecified, uncomplicated: Secondary | ICD-10-CM | POA: Diagnosis not present

## 2018-02-10 DIAGNOSIS — F329 Major depressive disorder, single episode, unspecified: Secondary | ICD-10-CM | POA: Diagnosis not present

## 2018-02-10 DIAGNOSIS — K029 Dental caries, unspecified: Secondary | ICD-10-CM | POA: Diagnosis not present

## 2018-02-10 DIAGNOSIS — F419 Anxiety disorder, unspecified: Secondary | ICD-10-CM | POA: Diagnosis not present

## 2018-02-10 DIAGNOSIS — Z79899 Other long term (current) drug therapy: Secondary | ICD-10-CM | POA: Diagnosis not present

## 2018-02-10 DIAGNOSIS — H9202 Otalgia, left ear: Secondary | ICD-10-CM | POA: Diagnosis not present

## 2018-02-10 DIAGNOSIS — H6122 Impacted cerumen, left ear: Secondary | ICD-10-CM | POA: Diagnosis not present

## 2018-02-16 DIAGNOSIS — Z79891 Long term (current) use of opiate analgesic: Secondary | ICD-10-CM | POA: Diagnosis not present

## 2018-02-16 DIAGNOSIS — M25562 Pain in left knee: Secondary | ICD-10-CM | POA: Diagnosis not present

## 2018-02-16 DIAGNOSIS — M79662 Pain in left lower leg: Secondary | ICD-10-CM | POA: Diagnosis not present

## 2018-02-16 DIAGNOSIS — M792 Neuralgia and neuritis, unspecified: Secondary | ICD-10-CM | POA: Diagnosis not present

## 2018-02-16 DIAGNOSIS — M545 Low back pain: Secondary | ICD-10-CM | POA: Diagnosis not present

## 2018-03-25 DIAGNOSIS — M545 Low back pain: Secondary | ICD-10-CM | POA: Diagnosis not present

## 2018-03-25 DIAGNOSIS — M25562 Pain in left knee: Secondary | ICD-10-CM | POA: Diagnosis not present

## 2018-03-25 DIAGNOSIS — M792 Neuralgia and neuritis, unspecified: Secondary | ICD-10-CM | POA: Diagnosis not present

## 2018-03-25 DIAGNOSIS — M79662 Pain in left lower leg: Secondary | ICD-10-CM | POA: Diagnosis not present

## 2018-03-25 DIAGNOSIS — Z79891 Long term (current) use of opiate analgesic: Secondary | ICD-10-CM | POA: Diagnosis not present

## 2018-04-22 ENCOUNTER — Ambulatory Visit (INDEPENDENT_AMBULATORY_CARE_PROVIDER_SITE_OTHER): Payer: Medicare Other | Admitting: Otolaryngology

## 2018-04-22 DIAGNOSIS — M25562 Pain in left knee: Secondary | ICD-10-CM | POA: Diagnosis not present

## 2018-04-22 DIAGNOSIS — M79662 Pain in left lower leg: Secondary | ICD-10-CM | POA: Diagnosis not present

## 2018-04-22 DIAGNOSIS — R42 Dizziness and giddiness: Secondary | ICD-10-CM | POA: Diagnosis not present

## 2018-04-22 DIAGNOSIS — Z79891 Long term (current) use of opiate analgesic: Secondary | ICD-10-CM | POA: Diagnosis not present

## 2018-04-22 DIAGNOSIS — H903 Sensorineural hearing loss, bilateral: Secondary | ICD-10-CM

## 2018-04-22 DIAGNOSIS — M792 Neuralgia and neuritis, unspecified: Secondary | ICD-10-CM | POA: Diagnosis not present

## 2018-04-26 ENCOUNTER — Other Ambulatory Visit (HOSPITAL_COMMUNITY): Payer: Self-pay | Admitting: Otolaryngology

## 2018-04-26 ENCOUNTER — Other Ambulatory Visit: Payer: Self-pay | Admitting: Otolaryngology

## 2018-04-26 DIAGNOSIS — H903 Sensorineural hearing loss, bilateral: Secondary | ICD-10-CM

## 2018-05-02 IMAGING — DX DG ANKLE COMPLETE 3+V*L*
3 series · 3 of 3 positions shown · non-contrast
Comparison: Left foot radiographs 11/30/2014

CLINICAL DATA: Left ankle pain.

EXAM:
LEFT ANKLE COMPLETE - 3+ VIEW

[ankle ap]
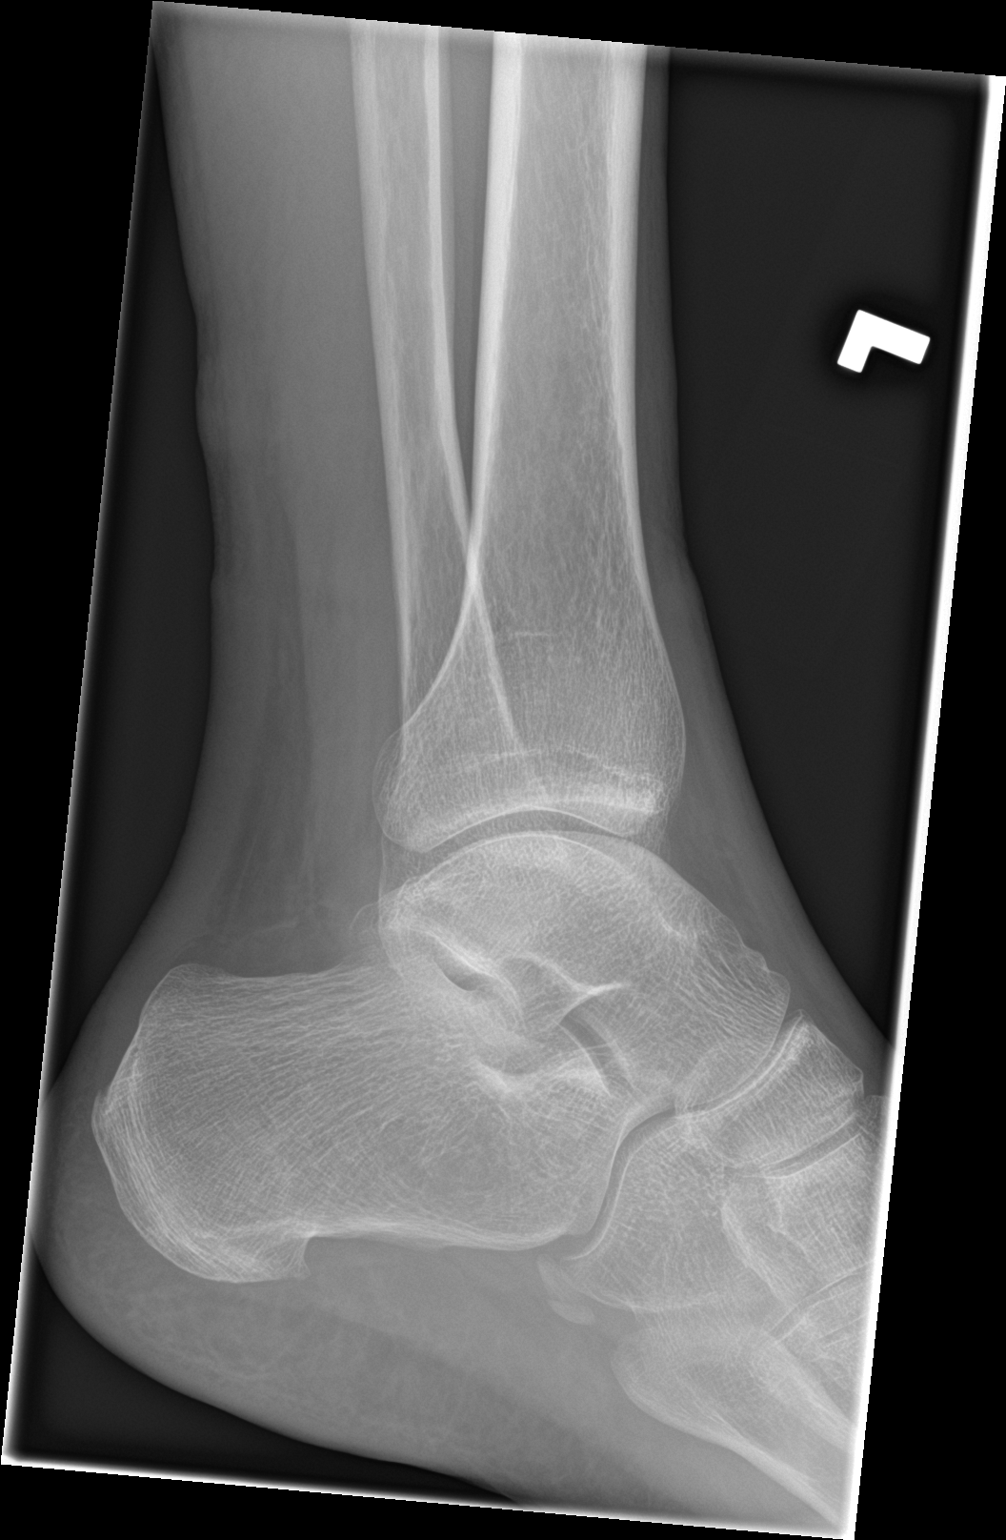

[ankle lat]
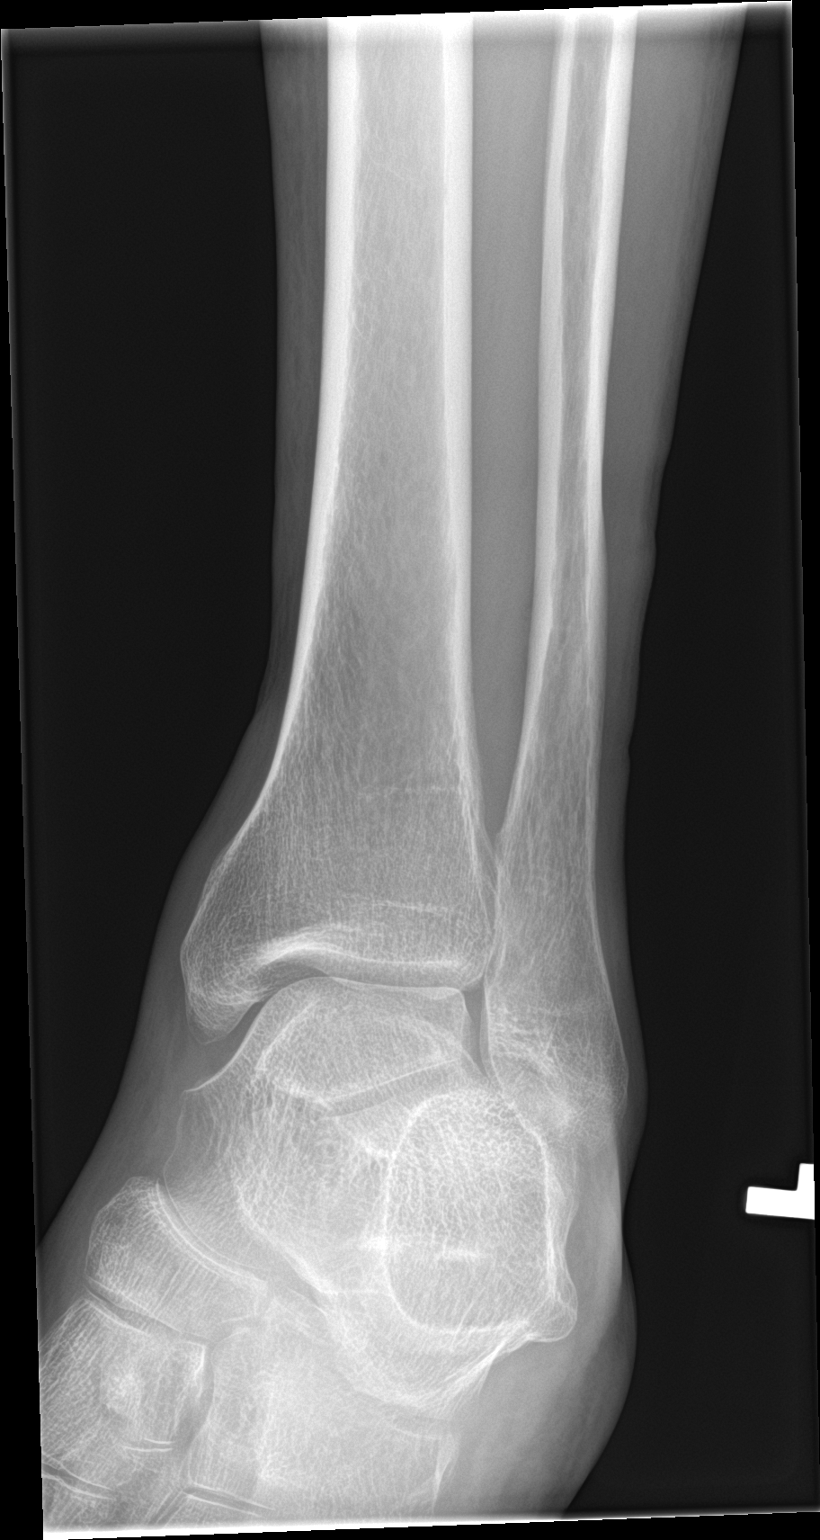

[ankle obl]
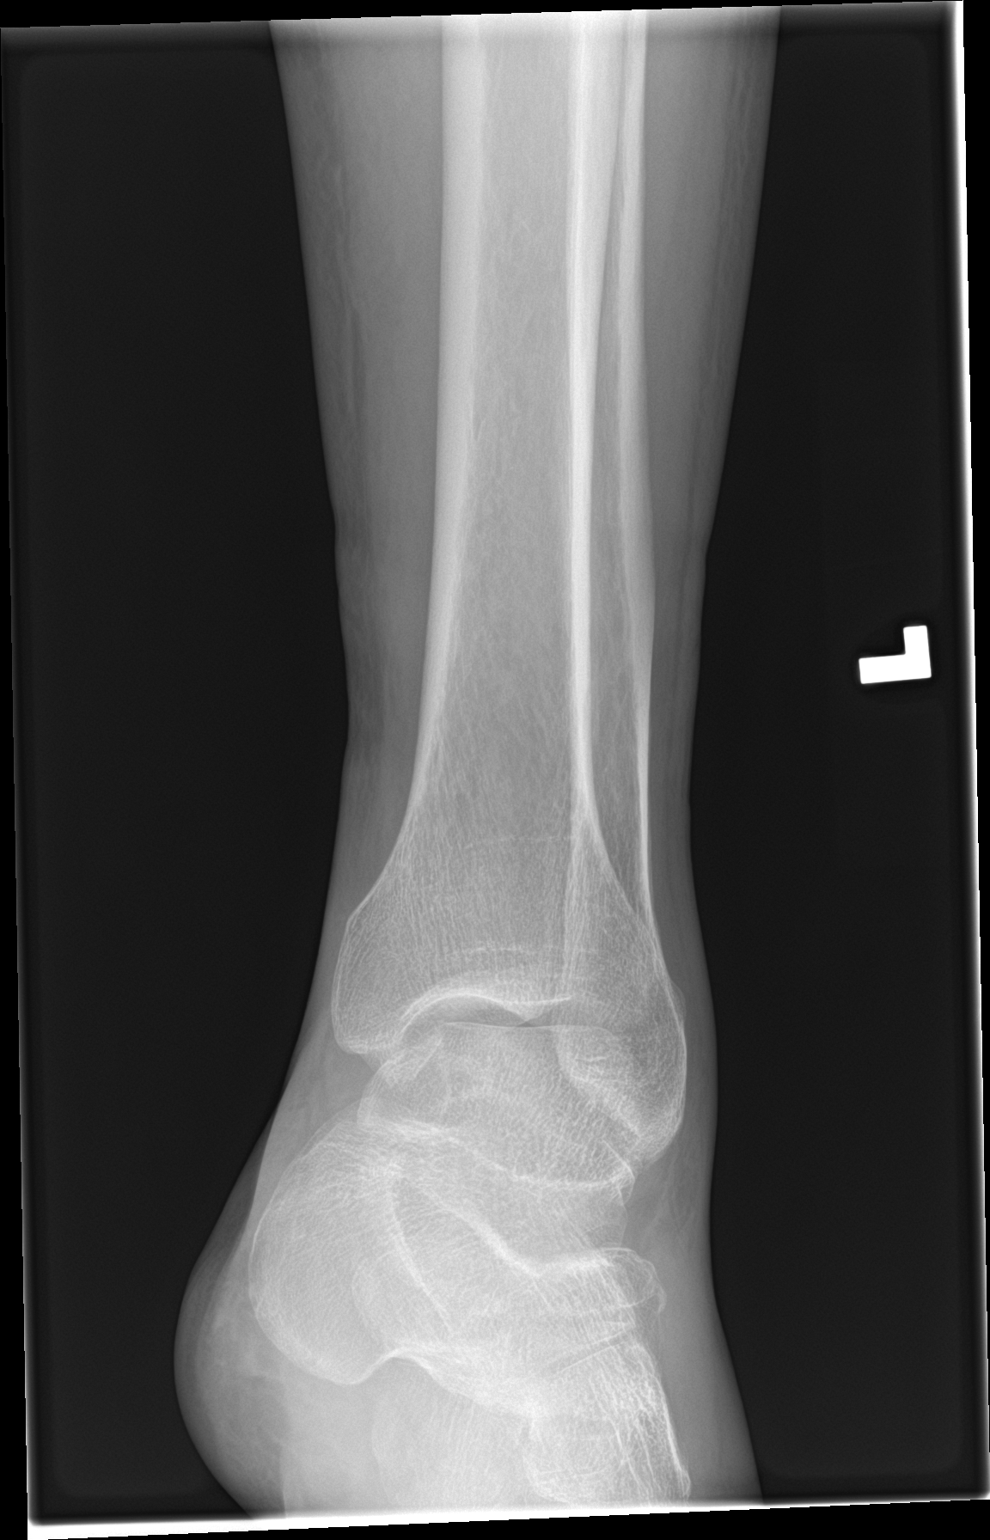

[3 of 3 positions shown; findings below may reference images not displayed]

FINDINGS: Suboptimal positioning on the AP radiograph. No evidence of acute
fracture or dislocation. Bone mineralization appears normal. No
significant arthropathic changes are identified. There may be
minimal soft tissue swelling about the ankle.
IMPRESSION: No acute osseous abnormality identified.

## 2018-05-05 ENCOUNTER — Encounter (HOSPITAL_COMMUNITY): Payer: Self-pay

## 2018-05-05 ENCOUNTER — Ambulatory Visit (HOSPITAL_COMMUNITY): Payer: Medicare Other

## 2018-05-24 DIAGNOSIS — M792 Neuralgia and neuritis, unspecified: Secondary | ICD-10-CM | POA: Diagnosis not present

## 2018-05-24 DIAGNOSIS — M79662 Pain in left lower leg: Secondary | ICD-10-CM | POA: Diagnosis not present

## 2018-05-24 DIAGNOSIS — M25562 Pain in left knee: Secondary | ICD-10-CM | POA: Diagnosis not present

## 2018-05-24 DIAGNOSIS — M545 Low back pain: Secondary | ICD-10-CM | POA: Diagnosis not present

## 2018-05-24 DIAGNOSIS — Z79891 Long term (current) use of opiate analgesic: Secondary | ICD-10-CM | POA: Diagnosis not present

## 2018-06-23 DIAGNOSIS — M545 Low back pain: Secondary | ICD-10-CM | POA: Diagnosis not present

## 2018-06-23 DIAGNOSIS — M25562 Pain in left knee: Secondary | ICD-10-CM | POA: Diagnosis not present

## 2018-06-23 DIAGNOSIS — Z79891 Long term (current) use of opiate analgesic: Secondary | ICD-10-CM | POA: Diagnosis not present

## 2018-06-23 DIAGNOSIS — M792 Neuralgia and neuritis, unspecified: Secondary | ICD-10-CM | POA: Diagnosis not present

## 2018-06-23 DIAGNOSIS — M79662 Pain in left lower leg: Secondary | ICD-10-CM | POA: Diagnosis not present

## 2018-07-21 DIAGNOSIS — M792 Neuralgia and neuritis, unspecified: Secondary | ICD-10-CM | POA: Diagnosis not present

## 2018-07-21 DIAGNOSIS — M25562 Pain in left knee: Secondary | ICD-10-CM | POA: Diagnosis not present

## 2018-07-21 DIAGNOSIS — M79662 Pain in left lower leg: Secondary | ICD-10-CM | POA: Diagnosis not present

## 2018-07-21 DIAGNOSIS — Z79891 Long term (current) use of opiate analgesic: Secondary | ICD-10-CM | POA: Diagnosis not present

## 2018-08-18 DIAGNOSIS — M25562 Pain in left knee: Secondary | ICD-10-CM | POA: Diagnosis not present

## 2018-08-18 DIAGNOSIS — M792 Neuralgia and neuritis, unspecified: Secondary | ICD-10-CM | POA: Diagnosis not present

## 2018-08-18 DIAGNOSIS — Z79891 Long term (current) use of opiate analgesic: Secondary | ICD-10-CM | POA: Diagnosis not present

## 2018-08-18 DIAGNOSIS — M79662 Pain in left lower leg: Secondary | ICD-10-CM | POA: Diagnosis not present

## 2018-09-14 DIAGNOSIS — M545 Low back pain: Secondary | ICD-10-CM | POA: Diagnosis not present

## 2018-09-14 DIAGNOSIS — M79662 Pain in left lower leg: Secondary | ICD-10-CM | POA: Diagnosis not present

## 2018-09-14 DIAGNOSIS — M25562 Pain in left knee: Secondary | ICD-10-CM | POA: Diagnosis not present

## 2018-09-14 DIAGNOSIS — Z79891 Long term (current) use of opiate analgesic: Secondary | ICD-10-CM | POA: Diagnosis not present

## 2018-09-14 DIAGNOSIS — M792 Neuralgia and neuritis, unspecified: Secondary | ICD-10-CM | POA: Diagnosis not present

## 2018-10-13 DIAGNOSIS — Z79891 Long term (current) use of opiate analgesic: Secondary | ICD-10-CM | POA: Diagnosis not present

## 2018-10-13 DIAGNOSIS — M545 Low back pain: Secondary | ICD-10-CM | POA: Diagnosis not present

## 2018-10-13 DIAGNOSIS — M792 Neuralgia and neuritis, unspecified: Secondary | ICD-10-CM | POA: Diagnosis not present

## 2018-10-13 DIAGNOSIS — M79662 Pain in left lower leg: Secondary | ICD-10-CM | POA: Diagnosis not present

## 2018-10-13 DIAGNOSIS — M25562 Pain in left knee: Secondary | ICD-10-CM | POA: Diagnosis not present

## 2018-12-08 DIAGNOSIS — Z79891 Long term (current) use of opiate analgesic: Secondary | ICD-10-CM | POA: Diagnosis not present

## 2018-12-08 DIAGNOSIS — M79662 Pain in left lower leg: Secondary | ICD-10-CM | POA: Diagnosis not present

## 2018-12-08 DIAGNOSIS — M545 Low back pain: Secondary | ICD-10-CM | POA: Diagnosis not present

## 2018-12-08 DIAGNOSIS — M25562 Pain in left knee: Secondary | ICD-10-CM | POA: Diagnosis not present

## 2018-12-08 DIAGNOSIS — M792 Neuralgia and neuritis, unspecified: Secondary | ICD-10-CM | POA: Diagnosis not present

## 2019-02-02 DIAGNOSIS — M79662 Pain in left lower leg: Secondary | ICD-10-CM | POA: Diagnosis not present

## 2019-02-02 DIAGNOSIS — M25562 Pain in left knee: Secondary | ICD-10-CM | POA: Diagnosis not present

## 2019-02-02 DIAGNOSIS — M545 Low back pain: Secondary | ICD-10-CM | POA: Diagnosis not present

## 2019-02-02 DIAGNOSIS — M792 Neuralgia and neuritis, unspecified: Secondary | ICD-10-CM | POA: Diagnosis not present

## 2019-02-02 DIAGNOSIS — Z79891 Long term (current) use of opiate analgesic: Secondary | ICD-10-CM | POA: Diagnosis not present

## 2019-06-01 DIAGNOSIS — M545 Low back pain: Secondary | ICD-10-CM | POA: Diagnosis not present

## 2019-06-01 DIAGNOSIS — M25562 Pain in left knee: Secondary | ICD-10-CM | POA: Diagnosis not present

## 2019-06-01 DIAGNOSIS — M79662 Pain in left lower leg: Secondary | ICD-10-CM | POA: Diagnosis not present

## 2019-06-01 DIAGNOSIS — Z79891 Long term (current) use of opiate analgesic: Secondary | ICD-10-CM | POA: Diagnosis not present

## 2019-06-01 DIAGNOSIS — M792 Neuralgia and neuritis, unspecified: Secondary | ICD-10-CM | POA: Diagnosis not present

## 2019-07-27 DIAGNOSIS — M545 Low back pain: Secondary | ICD-10-CM | POA: Diagnosis not present

## 2019-07-27 DIAGNOSIS — M79662 Pain in left lower leg: Secondary | ICD-10-CM | POA: Diagnosis not present

## 2019-07-27 DIAGNOSIS — Z79891 Long term (current) use of opiate analgesic: Secondary | ICD-10-CM | POA: Diagnosis not present

## 2019-07-27 DIAGNOSIS — M792 Neuralgia and neuritis, unspecified: Secondary | ICD-10-CM | POA: Diagnosis not present

## 2019-07-27 DIAGNOSIS — M25562 Pain in left knee: Secondary | ICD-10-CM | POA: Diagnosis not present

## 2019-08-01 DIAGNOSIS — Z79891 Long term (current) use of opiate analgesic: Secondary | ICD-10-CM | POA: Diagnosis not present

## 2019-08-01 DIAGNOSIS — M25562 Pain in left knee: Secondary | ICD-10-CM | POA: Diagnosis not present

## 2019-08-01 DIAGNOSIS — M545 Low back pain: Secondary | ICD-10-CM | POA: Diagnosis not present

## 2019-08-01 DIAGNOSIS — M79662 Pain in left lower leg: Secondary | ICD-10-CM | POA: Diagnosis not present

## 2019-08-10 DIAGNOSIS — I10 Essential (primary) hypertension: Secondary | ICD-10-CM | POA: Diagnosis not present

## 2019-08-10 DIAGNOSIS — Z1389 Encounter for screening for other disorder: Secondary | ICD-10-CM | POA: Diagnosis not present

## 2019-08-10 DIAGNOSIS — M545 Low back pain: Secondary | ICD-10-CM | POA: Diagnosis not present

## 2019-08-10 DIAGNOSIS — G43409 Hemiplegic migraine, not intractable, without status migrainosus: Secondary | ICD-10-CM | POA: Diagnosis not present

## 2019-08-10 DIAGNOSIS — Z0000 Encounter for general adult medical examination without abnormal findings: Secondary | ICD-10-CM | POA: Diagnosis not present

## 2019-08-10 DIAGNOSIS — F411 Generalized anxiety disorder: Secondary | ICD-10-CM | POA: Diagnosis not present

## 2019-08-15 DIAGNOSIS — M79662 Pain in left lower leg: Secondary | ICD-10-CM | POA: Diagnosis not present

## 2019-08-15 DIAGNOSIS — G43701 Chronic migraine without aura, not intractable, with status migrainosus: Secondary | ICD-10-CM | POA: Diagnosis not present

## 2019-08-15 DIAGNOSIS — M792 Neuralgia and neuritis, unspecified: Secondary | ICD-10-CM | POA: Diagnosis not present

## 2019-08-15 DIAGNOSIS — M545 Low back pain: Secondary | ICD-10-CM | POA: Diagnosis not present

## 2019-08-15 DIAGNOSIS — M25562 Pain in left knee: Secondary | ICD-10-CM | POA: Diagnosis not present

## 2019-08-15 DIAGNOSIS — Z79891 Long term (current) use of opiate analgesic: Secondary | ICD-10-CM | POA: Diagnosis not present

## 2019-09-06 DIAGNOSIS — M25562 Pain in left knee: Secondary | ICD-10-CM | POA: Diagnosis not present

## 2019-09-06 DIAGNOSIS — M792 Neuralgia and neuritis, unspecified: Secondary | ICD-10-CM | POA: Diagnosis not present

## 2019-09-06 DIAGNOSIS — G43701 Chronic migraine without aura, not intractable, with status migrainosus: Secondary | ICD-10-CM | POA: Diagnosis not present

## 2019-09-06 DIAGNOSIS — M79662 Pain in left lower leg: Secondary | ICD-10-CM | POA: Diagnosis not present

## 2019-09-06 DIAGNOSIS — M545 Low back pain: Secondary | ICD-10-CM | POA: Diagnosis not present

## 2019-09-06 DIAGNOSIS — Z79891 Long term (current) use of opiate analgesic: Secondary | ICD-10-CM | POA: Diagnosis not present

## 2019-11-18 DIAGNOSIS — M25562 Pain in left knee: Secondary | ICD-10-CM | POA: Diagnosis not present

## 2019-11-18 DIAGNOSIS — M79662 Pain in left lower leg: Secondary | ICD-10-CM | POA: Diagnosis not present

## 2019-11-18 DIAGNOSIS — Z79891 Long term (current) use of opiate analgesic: Secondary | ICD-10-CM | POA: Diagnosis not present

## 2019-11-18 DIAGNOSIS — R519 Headache, unspecified: Secondary | ICD-10-CM | POA: Diagnosis not present

## 2019-11-18 DIAGNOSIS — G43701 Chronic migraine without aura, not intractable, with status migrainosus: Secondary | ICD-10-CM | POA: Diagnosis not present

## 2019-11-18 DIAGNOSIS — M792 Neuralgia and neuritis, unspecified: Secondary | ICD-10-CM | POA: Diagnosis not present

## 2019-11-18 DIAGNOSIS — M545 Low back pain: Secondary | ICD-10-CM | POA: Diagnosis not present

## 2020-01-17 DIAGNOSIS — Z79891 Long term (current) use of opiate analgesic: Secondary | ICD-10-CM | POA: Diagnosis not present

## 2020-01-18 DIAGNOSIS — G43701 Chronic migraine without aura, not intractable, with status migrainosus: Secondary | ICD-10-CM | POA: Insufficient documentation

## 2020-01-18 DIAGNOSIS — M79662 Pain in left lower leg: Secondary | ICD-10-CM | POA: Diagnosis not present

## 2020-01-18 DIAGNOSIS — M792 Neuralgia and neuritis, unspecified: Secondary | ICD-10-CM | POA: Diagnosis not present

## 2020-01-18 DIAGNOSIS — Z79891 Long term (current) use of opiate analgesic: Secondary | ICD-10-CM | POA: Diagnosis not present

## 2020-01-18 DIAGNOSIS — R519 Headache, unspecified: Secondary | ICD-10-CM | POA: Diagnosis not present

## 2020-01-18 DIAGNOSIS — M25562 Pain in left knee: Secondary | ICD-10-CM | POA: Diagnosis not present

## 2020-01-18 DIAGNOSIS — M545 Low back pain, unspecified: Secondary | ICD-10-CM | POA: Diagnosis not present

## 2020-03-23 DIAGNOSIS — Z79891 Long term (current) use of opiate analgesic: Secondary | ICD-10-CM | POA: Diagnosis not present

## 2020-03-23 DIAGNOSIS — G4700 Insomnia, unspecified: Secondary | ICD-10-CM | POA: Diagnosis not present

## 2020-03-23 DIAGNOSIS — M792 Neuralgia and neuritis, unspecified: Secondary | ICD-10-CM | POA: Diagnosis not present

## 2020-03-23 DIAGNOSIS — G43701 Chronic migraine without aura, not intractable, with status migrainosus: Secondary | ICD-10-CM | POA: Diagnosis not present

## 2020-03-23 DIAGNOSIS — M79662 Pain in left lower leg: Secondary | ICD-10-CM | POA: Diagnosis not present

## 2020-03-23 DIAGNOSIS — M545 Low back pain, unspecified: Secondary | ICD-10-CM | POA: Diagnosis not present

## 2020-03-23 DIAGNOSIS — M25562 Pain in left knee: Secondary | ICD-10-CM | POA: Diagnosis not present

## 2020-03-23 DIAGNOSIS — R519 Headache, unspecified: Secondary | ICD-10-CM | POA: Diagnosis not present

## 2020-04-20 DIAGNOSIS — R519 Headache, unspecified: Secondary | ICD-10-CM | POA: Diagnosis not present

## 2020-04-20 DIAGNOSIS — M79662 Pain in left lower leg: Secondary | ICD-10-CM | POA: Diagnosis not present

## 2020-04-20 DIAGNOSIS — M25562 Pain in left knee: Secondary | ICD-10-CM | POA: Diagnosis not present

## 2020-04-20 DIAGNOSIS — Z79891 Long term (current) use of opiate analgesic: Secondary | ICD-10-CM | POA: Diagnosis not present

## 2020-04-20 DIAGNOSIS — G4700 Insomnia, unspecified: Secondary | ICD-10-CM | POA: Diagnosis not present

## 2020-04-20 DIAGNOSIS — M545 Low back pain, unspecified: Secondary | ICD-10-CM | POA: Diagnosis not present

## 2020-04-20 DIAGNOSIS — G43701 Chronic migraine without aura, not intractable, with status migrainosus: Secondary | ICD-10-CM | POA: Diagnosis not present

## 2020-04-20 DIAGNOSIS — M792 Neuralgia and neuritis, unspecified: Secondary | ICD-10-CM | POA: Diagnosis not present

## 2020-04-23 DIAGNOSIS — Z23 Encounter for immunization: Secondary | ICD-10-CM | POA: Diagnosis not present

## 2020-05-23 DIAGNOSIS — M79662 Pain in left lower leg: Secondary | ICD-10-CM | POA: Diagnosis not present

## 2020-05-23 DIAGNOSIS — G4700 Insomnia, unspecified: Secondary | ICD-10-CM | POA: Diagnosis not present

## 2020-05-23 DIAGNOSIS — M545 Low back pain, unspecified: Secondary | ICD-10-CM | POA: Diagnosis not present

## 2020-05-23 DIAGNOSIS — Z79891 Long term (current) use of opiate analgesic: Secondary | ICD-10-CM | POA: Diagnosis not present

## 2020-05-23 DIAGNOSIS — G43701 Chronic migraine without aura, not intractable, with status migrainosus: Secondary | ICD-10-CM | POA: Diagnosis not present

## 2020-05-23 DIAGNOSIS — M792 Neuralgia and neuritis, unspecified: Secondary | ICD-10-CM | POA: Diagnosis not present

## 2020-05-23 DIAGNOSIS — M25562 Pain in left knee: Secondary | ICD-10-CM | POA: Diagnosis not present

## 2020-05-23 DIAGNOSIS — R519 Headache, unspecified: Secondary | ICD-10-CM | POA: Diagnosis not present

## 2020-07-11 DIAGNOSIS — Z79891 Long term (current) use of opiate analgesic: Secondary | ICD-10-CM | POA: Diagnosis not present

## 2020-07-11 DIAGNOSIS — G43701 Chronic migraine without aura, not intractable, with status migrainosus: Secondary | ICD-10-CM | POA: Diagnosis not present

## 2020-07-11 DIAGNOSIS — G4700 Insomnia, unspecified: Secondary | ICD-10-CM | POA: Diagnosis not present

## 2020-07-11 DIAGNOSIS — M792 Neuralgia and neuritis, unspecified: Secondary | ICD-10-CM | POA: Diagnosis not present

## 2020-07-11 DIAGNOSIS — M79662 Pain in left lower leg: Secondary | ICD-10-CM | POA: Diagnosis not present

## 2020-07-11 DIAGNOSIS — M25562 Pain in left knee: Secondary | ICD-10-CM | POA: Diagnosis not present

## 2020-07-11 DIAGNOSIS — R519 Headache, unspecified: Secondary | ICD-10-CM | POA: Diagnosis not present

## 2020-07-11 DIAGNOSIS — M545 Low back pain, unspecified: Secondary | ICD-10-CM | POA: Diagnosis not present

## 2020-08-06 DIAGNOSIS — W269XXA Contact with unspecified sharp object(s), initial encounter: Secondary | ICD-10-CM | POA: Diagnosis not present

## 2020-08-06 DIAGNOSIS — S91311A Laceration without foreign body, right foot, initial encounter: Secondary | ICD-10-CM | POA: Diagnosis not present

## 2020-08-08 DIAGNOSIS — M79662 Pain in left lower leg: Secondary | ICD-10-CM | POA: Diagnosis not present

## 2020-08-08 DIAGNOSIS — M25562 Pain in left knee: Secondary | ICD-10-CM | POA: Diagnosis not present

## 2020-08-08 DIAGNOSIS — R519 Headache, unspecified: Secondary | ICD-10-CM | POA: Diagnosis not present

## 2020-08-08 DIAGNOSIS — G43701 Chronic migraine without aura, not intractable, with status migrainosus: Secondary | ICD-10-CM | POA: Diagnosis not present

## 2020-08-08 DIAGNOSIS — M792 Neuralgia and neuritis, unspecified: Secondary | ICD-10-CM | POA: Diagnosis not present

## 2020-08-08 DIAGNOSIS — M545 Low back pain, unspecified: Secondary | ICD-10-CM | POA: Diagnosis not present

## 2020-08-08 DIAGNOSIS — Z79891 Long term (current) use of opiate analgesic: Secondary | ICD-10-CM | POA: Diagnosis not present

## 2020-08-08 DIAGNOSIS — G4700 Insomnia, unspecified: Secondary | ICD-10-CM | POA: Diagnosis not present

## 2021-01-22 ENCOUNTER — Other Ambulatory Visit (HOSPITAL_COMMUNITY): Payer: Self-pay | Admitting: Neurology

## 2021-01-22 DIAGNOSIS — M79662 Pain in left lower leg: Secondary | ICD-10-CM | POA: Diagnosis not present

## 2021-01-22 DIAGNOSIS — M792 Neuralgia and neuritis, unspecified: Secondary | ICD-10-CM | POA: Diagnosis not present

## 2021-01-22 DIAGNOSIS — M25562 Pain in left knee: Secondary | ICD-10-CM

## 2021-01-22 DIAGNOSIS — M545 Low back pain, unspecified: Secondary | ICD-10-CM | POA: Diagnosis not present

## 2021-01-22 DIAGNOSIS — R519 Headache, unspecified: Secondary | ICD-10-CM | POA: Diagnosis not present

## 2021-01-22 DIAGNOSIS — G43701 Chronic migraine without aura, not intractable, with status migrainosus: Secondary | ICD-10-CM | POA: Diagnosis not present

## 2021-01-22 DIAGNOSIS — Z79891 Long term (current) use of opiate analgesic: Secondary | ICD-10-CM | POA: Diagnosis not present

## 2021-01-22 DIAGNOSIS — G4700 Insomnia, unspecified: Secondary | ICD-10-CM | POA: Diagnosis not present

## 2021-02-02 DIAGNOSIS — G8929 Other chronic pain: Secondary | ICD-10-CM | POA: Insufficient documentation

## 2021-02-02 DIAGNOSIS — R569 Unspecified convulsions: Secondary | ICD-10-CM | POA: Insufficient documentation

## 2021-02-02 DIAGNOSIS — R918 Other nonspecific abnormal finding of lung field: Secondary | ICD-10-CM | POA: Diagnosis not present

## 2021-02-02 DIAGNOSIS — R000 Tachycardia, unspecified: Secondary | ICD-10-CM | POA: Diagnosis not present

## 2021-02-02 DIAGNOSIS — F10939 Alcohol use, unspecified with withdrawal, unspecified: Secondary | ICD-10-CM | POA: Insufficient documentation

## 2021-02-02 DIAGNOSIS — R0689 Other abnormalities of breathing: Secondary | ICD-10-CM | POA: Diagnosis not present

## 2021-02-02 DIAGNOSIS — I10 Essential (primary) hypertension: Secondary | ICD-10-CM | POA: Diagnosis not present

## 2021-02-02 DIAGNOSIS — R059 Cough, unspecified: Secondary | ICD-10-CM | POA: Diagnosis not present

## 2021-02-02 DIAGNOSIS — R42 Dizziness and giddiness: Secondary | ICD-10-CM | POA: Diagnosis not present

## 2021-02-02 DIAGNOSIS — Z79891 Long term (current) use of opiate analgesic: Secondary | ICD-10-CM | POA: Insufficient documentation

## 2021-02-13 DIAGNOSIS — G40919 Epilepsy, unspecified, intractable, without status epilepticus: Secondary | ICD-10-CM | POA: Diagnosis not present

## 2021-02-13 DIAGNOSIS — E876 Hypokalemia: Secondary | ICD-10-CM | POA: Diagnosis not present

## 2021-02-13 DIAGNOSIS — E612 Magnesium deficiency: Secondary | ICD-10-CM | POA: Diagnosis not present

## 2021-03-11 DIAGNOSIS — Z23 Encounter for immunization: Secondary | ICD-10-CM | POA: Diagnosis not present

## 2021-03-27 DIAGNOSIS — R109 Unspecified abdominal pain: Secondary | ICD-10-CM | POA: Diagnosis not present

## 2021-03-27 DIAGNOSIS — R112 Nausea with vomiting, unspecified: Secondary | ICD-10-CM | POA: Diagnosis not present

## 2021-03-28 DIAGNOSIS — Z885 Allergy status to narcotic agent status: Secondary | ICD-10-CM | POA: Diagnosis not present

## 2021-03-28 DIAGNOSIS — R1013 Epigastric pain: Secondary | ICD-10-CM | POA: Diagnosis not present

## 2021-03-28 DIAGNOSIS — K2900 Acute gastritis without bleeding: Secondary | ICD-10-CM | POA: Diagnosis not present

## 2021-03-28 DIAGNOSIS — K8689 Other specified diseases of pancreas: Secondary | ICD-10-CM | POA: Diagnosis not present

## 2021-03-28 DIAGNOSIS — K760 Fatty (change of) liver, not elsewhere classified: Secondary | ICD-10-CM | POA: Diagnosis not present

## 2021-03-28 DIAGNOSIS — R109 Unspecified abdominal pain: Secondary | ICD-10-CM | POA: Diagnosis not present

## 2021-03-28 DIAGNOSIS — R1010 Upper abdominal pain, unspecified: Secondary | ICD-10-CM | POA: Diagnosis not present

## 2021-03-28 DIAGNOSIS — R112 Nausea with vomiting, unspecified: Secondary | ICD-10-CM | POA: Diagnosis not present

## 2021-03-31 DIAGNOSIS — Z20822 Contact with and (suspected) exposure to covid-19: Secondary | ICD-10-CM | POA: Diagnosis not present

## 2021-03-31 DIAGNOSIS — F1090 Alcohol use, unspecified, uncomplicated: Secondary | ICD-10-CM | POA: Diagnosis not present

## 2021-03-31 DIAGNOSIS — N200 Calculus of kidney: Secondary | ICD-10-CM | POA: Diagnosis not present

## 2021-03-31 DIAGNOSIS — R1084 Generalized abdominal pain: Secondary | ICD-10-CM | POA: Diagnosis not present

## 2021-03-31 DIAGNOSIS — K2100 Gastro-esophageal reflux disease with esophagitis, without bleeding: Secondary | ICD-10-CM | POA: Diagnosis not present

## 2021-03-31 DIAGNOSIS — I700 Atherosclerosis of aorta: Secondary | ICD-10-CM | POA: Diagnosis not present

## 2021-03-31 DIAGNOSIS — K5090 Crohn's disease, unspecified, without complications: Secondary | ICD-10-CM | POA: Diagnosis not present

## 2021-03-31 DIAGNOSIS — E876 Hypokalemia: Secondary | ICD-10-CM | POA: Diagnosis not present

## 2021-03-31 DIAGNOSIS — N400 Enlarged prostate without lower urinary tract symptoms: Secondary | ICD-10-CM | POA: Diagnosis not present

## 2021-03-31 DIAGNOSIS — F17210 Nicotine dependence, cigarettes, uncomplicated: Secondary | ICD-10-CM | POA: Diagnosis not present

## 2021-03-31 DIAGNOSIS — K449 Diaphragmatic hernia without obstruction or gangrene: Secondary | ICD-10-CM | POA: Diagnosis not present

## 2021-03-31 DIAGNOSIS — R569 Unspecified convulsions: Secondary | ICD-10-CM | POA: Diagnosis not present

## 2021-03-31 DIAGNOSIS — R0902 Hypoxemia: Secondary | ICD-10-CM | POA: Diagnosis not present

## 2021-04-04 ENCOUNTER — Encounter (INDEPENDENT_AMBULATORY_CARE_PROVIDER_SITE_OTHER): Payer: Self-pay | Admitting: *Deleted

## 2021-04-26 DIAGNOSIS — R519 Headache, unspecified: Secondary | ICD-10-CM | POA: Diagnosis not present

## 2021-04-26 DIAGNOSIS — Z79891 Long term (current) use of opiate analgesic: Secondary | ICD-10-CM | POA: Diagnosis not present

## 2021-04-26 DIAGNOSIS — G4700 Insomnia, unspecified: Secondary | ICD-10-CM | POA: Diagnosis not present

## 2021-04-26 DIAGNOSIS — G43701 Chronic migraine without aura, not intractable, with status migrainosus: Secondary | ICD-10-CM | POA: Diagnosis not present

## 2021-04-26 DIAGNOSIS — M545 Low back pain, unspecified: Secondary | ICD-10-CM | POA: Diagnosis not present

## 2021-05-21 ENCOUNTER — Encounter (INDEPENDENT_AMBULATORY_CARE_PROVIDER_SITE_OTHER): Payer: Self-pay | Admitting: Gastroenterology

## 2021-05-21 ENCOUNTER — Encounter (INDEPENDENT_AMBULATORY_CARE_PROVIDER_SITE_OTHER): Payer: Self-pay

## 2021-05-21 ENCOUNTER — Other Ambulatory Visit: Payer: Self-pay

## 2021-05-21 ENCOUNTER — Other Ambulatory Visit (INDEPENDENT_AMBULATORY_CARE_PROVIDER_SITE_OTHER): Payer: Self-pay

## 2021-05-21 ENCOUNTER — Telehealth (INDEPENDENT_AMBULATORY_CARE_PROVIDER_SITE_OTHER): Payer: Self-pay

## 2021-05-21 ENCOUNTER — Ambulatory Visit (INDEPENDENT_AMBULATORY_CARE_PROVIDER_SITE_OTHER): Payer: Medicare HMO | Admitting: Gastroenterology

## 2021-05-21 VITALS — BP 119/76 | HR 94 | Temp 98.0°F | Ht 65.0 in | Wt 154.8 lb

## 2021-05-21 DIAGNOSIS — R6881 Early satiety: Secondary | ICD-10-CM

## 2021-05-21 DIAGNOSIS — R070 Pain in throat: Secondary | ICD-10-CM

## 2021-05-21 DIAGNOSIS — R1310 Dysphagia, unspecified: Secondary | ICD-10-CM | POA: Diagnosis not present

## 2021-05-21 DIAGNOSIS — G8929 Other chronic pain: Secondary | ICD-10-CM

## 2021-05-21 DIAGNOSIS — R1013 Epigastric pain: Secondary | ICD-10-CM

## 2021-05-21 DIAGNOSIS — Z1211 Encounter for screening for malignant neoplasm of colon: Secondary | ICD-10-CM

## 2021-05-21 MED ORDER — PEG 3350-KCL-NA BICARB-NACL 420 G PO SOLR: 4000.0000 mL | ORAL | 0 refills | Status: DC

## 2021-05-21 MED ORDER — OMEPRAZOLE 40 MG PO CPDR: 40.0000 mg | DELAYED_RELEASE_CAPSULE | Freq: Two times a day (BID) | ORAL | 1 refills | Status: DC

## 2021-05-21 MED ORDER — SUCRALFATE 1 GM/10ML PO SUSP: 1.0000 g | Freq: Three times a day (TID) | ORAL | 1 refills | Status: DC

## 2021-05-21 NOTE — Patient Instructions (Addendum)
Please increase your omeprazole 40mg  to twice a day, take 30-45 minutes prior to eating in the morning and 30-45 minutes prior to dinner. I have sent carafate liquid to the pharmacy for you, if this is too expensive when you go, please continue with the carafate tablets that you already have. Please continue to avoid alcohol. Please avoid NSAIDs (advil, aleve, naproxen, goody powder, ibuprofen). We will get you scheduled for EGD for further evaluation of your symptoms, as well as colonoscopy as you have never had one and are due for initial screening.

## 2021-05-21 NOTE — Progress Notes (Signed)
Referring Provider: Neale Burly, MD Primary Care Physician:  Neale Burly, MD Primary GI Physician: new  Chief Complaint  Patient presents with   Abdominal Pain    Patient states he has mid epigastric pain and Burning sensation in throat. He states he has had it for several years. He is taking carafate QID. He states he has a history of Crohns diagnosed with he was 51 yrs old.   HPI:   Joshua Hale. is a 51 y.o. male with past medical history of PTSD, Crohn's disease, GERD.  Patient presenting today as a new patient for mid epigastric pain and burning to his throat. Per reviewable notes from PCP, patient has hx of alcohol abuse.   Patient states that he has had issues with acid reflux for a while, he states that symptoms got worse over the past year with constant burning in his esophagus and throat. States previously he used tums but these do not help anymore. Has frequent acid regurgitation. He feels that he has nausea all the time. Has dysphagia with both pills and food and often has to cough things back up. States he has to take very small bites and also endorses chest pain/pressure, worse with eating, feeling as though his "chest is going to explode." Does have early satiety and bloating as well. Weight is stable. Symptoms do not seem to depend on what he eats. He burning in throat/chest and acid regurgitation can occur any time of the day. He does eat dinner later in the day but also goes to bed a few hours after. Endorses some upper abdominal pain at times as well, also not precipitated or alleviated by anything.  Denies any blood or melena. He has chronic diarrhea with 3 episodes per day. PCP started him on omeprazole 40mg  once daily and carafate 1g tablets QID, has not noticed much improvement of symptoms from this. He has hx of alcohol abuse, though reportedly has not drank since December, he takes NSAIDs just occasionally.   Notably, had previous GES in 2009 that showed some  delayed gastric emptying.   States that he was diagnosed with Crohn's disease at age 32 at [facility], had labs and rectal exam but reports he has never had a colonoscopy?  NSAID use: as needed Social hx: reports last alcoholic drink was in Dec, he drank heavily prior to this with both beer and liquor Fam hx: no CRC or liver disease.  Last Colonoscopy:never  Last Endoscopy:2009 Dr. Fuller Plan hiatal hernia 3cm, normal esophagus and duodenum.   Past Medical History:  Diagnosis Date   Crohn disease ([place])    Post traumatic stress disorder (PTSD)     Past Surgical History:  Procedure Laterality Date   ABDOMINAL SURGERY     stents   CHOLECYSTECTOMY     KNEE SURGERY     thumb surgery      Current Outpatient Medications  Medication Sig Dispense Refill   busPIRone (BUSPAR) 15 MG tablet Take 15 mg by mouth 3 (three) times daily.     diazepam (VALIUM) 10 MG tablet Take 10 mg by mouth daily. And take 2 tablets at bedtime     magnesium 30 MG tablet Take 30 mg by mouth daily at 6 (six) AM.     omeprazole (PRILOSEC) 40 MG capsule Take 40 mg by mouth daily.     ondansetron (ZOFRAN-ODT) 4 MG disintegrating tablet Take 4 mg by mouth every 8 (eight) hours as needed.     potassium  chloride SA (KLOR-CON M) 20 MEQ tablet Take 20 mEq by mouth daily.     sucralfate (CARAFATE) 1 g tablet Take 1 g by mouth 4 (four) times daily.     No current facility-administered medications for this visit.    Allergies as of 05/21/2021 - Review Complete 05/21/2021  Allergen Reaction Noted   Baclofen Swelling 11/30/2014   Fentanyl Hives 11/21/2010   Onion Swelling 11/21/2010   Ultram [tramadol] Nausea Only 12/02/2011    Family History  Problem Relation Age of Onset   Hypertension Mother    Heart failure Mother    COPD Mother     Social History   Socioeconomic History   Marital status: Married    Spouse name: Not on file   Number of children: Not on file   Years of education: Not on file   Highest education  level: Not on file  Occupational History   Not on file  Tobacco Use   Smoking status: Every Day    Packs/day: 1.50    Types: Cigarettes   Smokeless tobacco: Never  Vaping Use   Vaping Use: Never used  Substance and Sexual Activity   Alcohol use: No   Drug use: Yes    Types: Marijuana   Sexual activity: Yes  Other Topics Concern   Not on file  Social History Narrative   Not on file   Social Determinants of Health   Financial Resource Strain: Not on file  Food Insecurity: Not on file  Transportation Needs: Not on file  Physical Activity: Not on file  Stress: Not on file  Social Connections: Not on file   Review of systems General: negative for malaise, night sweats, fever, chills, weight loss Neck: Negative for lumps, goiter, pain and significant neck swelling Resp: Negative for cough, wheezing, dyspnea at rest CV: Negative for chest pain, leg swelling, palpitations, orthopnea GI: denies melena, hematochezia, constipation, or unintentional weight loss. +nausea +dysphagia +odynophagia +acid regurgitation +epigastric pain +diarrhea  MSK: Negative for joint pain or swelling, back pain, and muscle pain. Derm: Negative for itching or rash Psych: Denies depression, anxiety, memory loss, confusion. No homicidal or suicidal ideation.  Heme: Negative for prolonged bleeding, bruising easily, and swollen nodes. Endocrine: Negative for cold or heat intolerance, polyuria, polydipsia and goiter. Neuro: negative for tremor, gait imbalance, syncope and seizures. The remainder of the review of systems is noncontributory.  Physical Exam: BP 119/76 (BP Location: Left Arm, Patient Position: Sitting, Cuff Size: Small)    Pulse 94    Temp 98 F (36.7 C) (Oral)    Ht 5\' 5"  (1.651 m)    Wt 154 lb 12.8 oz (70.2 kg)    BMI 25.76 kg/m  General:   Alert and oriented. No distress noted. Pleasant and cooperative.  Head:  Normocephalic and atraumatic. Eyes:  Conjuctiva clear without scleral  icterus. Mouth:  Oral mucosa pink and moist. Good dentition. No lesions. Heart: Normal rate and rhythm, s1 and s2 heart sounds present.  Lungs: Clear lung sounds in all lobes. Respirations equal and unlabored. Abdomen:  +BS, soft, and non-distended, generalized TTP. No rebound or guarding. No HSM or masses noted. Derm: No palmar erythema or jaundice Msk:  Symmetrical without gross deformities. Normal posture. Extremities:  Without edema. Neurologic:  Alert and  oriented x4 Psych:  Alert and cooperative. Normal mood and affect.  Invalid input(s): 6 MONTHS   ASSESSMENT: Joshua Hale. is a 51 y.o. male presenting today for mid epigastric pain and burning  to throat and esophagus.  Reportedly patient has had upper abdominal/epigastric pain and burning to his throat and chest for some time, though worse over the past year, previously drank ETOH very heavily but has not drank any since Dec. PCP started him on PPI daily and Carafate 1g TID with meals and at bedtime without any relief of symptoms. He has no rectal bleeding or melena but endorses dysphagia with pills and foods, nausea and early satiety. We will get him scheduled for EGD for further evaluation as we cannot rule out gastritis, esophagitis and/or esophageal stricture, web or stenosis.   Reported hx of Crohn's, however, Patient has never had colonoscopy, given his age, we will get him scheduled for initial CRC screening via colonoscopy.  Indications, risks and benefits of procedure discussed in detail with patient. Patient verbalized understanding and is in agreement to proceed with EGD/Colonoscopy at this time.   All questions were answered, patient verbalized understanding and is in agreement with plan as outline above.   PLAN:  Schedule diagnostic EGD and initial CRC screening colonoscopy 2. Rx carafate 1g suspension QID 3. Increase omeprazole 40mg  to BID 4. Continue to avoid alcohol and NSAIDs   Follow Up: 3  months  Joshua Dunwoody L. Alver Sorrow, MSN, APRN, AGNP-C Adult-Gerontology Nurse Practitioner [facility]

## 2021-05-21 NOTE — Telephone Encounter (Signed)
Joshua Hale, CMA  ?

## 2021-06-19 ENCOUNTER — Other Ambulatory Visit (INDEPENDENT_AMBULATORY_CARE_PROVIDER_SITE_OTHER): Payer: Self-pay

## 2021-06-21 ENCOUNTER — Ambulatory Visit (HOSPITAL_BASED_OUTPATIENT_CLINIC_OR_DEPARTMENT_OTHER): Payer: Medicare HMO | Admitting: Anesthesiology

## 2021-06-21 ENCOUNTER — Encounter (HOSPITAL_COMMUNITY): Payer: Self-pay | Admitting: Gastroenterology

## 2021-06-21 ENCOUNTER — Encounter (HOSPITAL_COMMUNITY)
Admission: RE | Disposition: A | Discharge status: Home / Self Care | Payer: Self-pay | Source: Ambulatory Visit | Attending: Gastroenterology

## 2021-06-21 ENCOUNTER — Ambulatory Visit (HOSPITAL_COMMUNITY): Payer: Medicare HMO | Admitting: Anesthesiology

## 2021-06-21 ENCOUNTER — Other Ambulatory Visit: Payer: Self-pay

## 2021-06-21 ENCOUNTER — Ambulatory Visit (HOSPITAL_COMMUNITY)
Admission: RE | Admit: 2021-06-21 | Discharge: 2021-06-21 | Disposition: A | Discharge status: Home / Self Care | Payer: Medicare HMO | Source: Ambulatory Visit | Attending: Gastroenterology | Admitting: Gastroenterology

## 2021-06-21 DIAGNOSIS — K635 Polyp of colon: Secondary | ICD-10-CM | POA: Diagnosis not present

## 2021-06-21 DIAGNOSIS — K6389 Other specified diseases of intestine: Secondary | ICD-10-CM | POA: Insufficient documentation

## 2021-06-21 DIAGNOSIS — R1310 Dysphagia, unspecified: Secondary | ICD-10-CM | POA: Diagnosis not present

## 2021-06-21 DIAGNOSIS — F17210 Nicotine dependence, cigarettes, uncomplicated: Secondary | ICD-10-CM | POA: Insufficient documentation

## 2021-06-21 DIAGNOSIS — K2100 Gastro-esophageal reflux disease with esophagitis, without bleeding: Secondary | ICD-10-CM | POA: Diagnosis not present

## 2021-06-21 DIAGNOSIS — K5090 Crohn's disease, unspecified, without complications: Secondary | ICD-10-CM | POA: Insufficient documentation

## 2021-06-21 DIAGNOSIS — X58XXXA Exposure to other specified factors, initial encounter: Secondary | ICD-10-CM | POA: Insufficient documentation

## 2021-06-21 DIAGNOSIS — R6881 Early satiety: Secondary | ICD-10-CM | POA: Diagnosis not present

## 2021-06-21 DIAGNOSIS — F418 Other specified anxiety disorders: Secondary | ICD-10-CM | POA: Insufficient documentation

## 2021-06-21 DIAGNOSIS — R109 Unspecified abdominal pain: Secondary | ICD-10-CM | POA: Insufficient documentation

## 2021-06-21 DIAGNOSIS — K449 Diaphragmatic hernia without obstruction or gangrene: Secondary | ICD-10-CM | POA: Diagnosis not present

## 2021-06-21 DIAGNOSIS — Z1211 Encounter for screening for malignant neoplasm of colon: Secondary | ICD-10-CM | POA: Diagnosis not present

## 2021-06-21 DIAGNOSIS — D120 Benign neoplasm of cecum: Secondary | ICD-10-CM | POA: Diagnosis not present

## 2021-06-21 DIAGNOSIS — R070 Pain in throat: Secondary | ICD-10-CM

## 2021-06-21 DIAGNOSIS — K2950 Unspecified chronic gastritis without bleeding: Secondary | ICD-10-CM | POA: Insufficient documentation

## 2021-06-21 DIAGNOSIS — F4310 Post-traumatic stress disorder, unspecified: Secondary | ICD-10-CM | POA: Insufficient documentation

## 2021-06-21 DIAGNOSIS — K2990 Gastroduodenitis, unspecified, without bleeding: Secondary | ICD-10-CM | POA: Diagnosis not present

## 2021-06-21 HISTORY — DX: Cardiac murmur, unspecified: R01.1

## 2021-06-21 HISTORY — DX: Depression, unspecified: F32.A

## 2021-06-21 LAB — HM COLONOSCOPY

## 2021-06-21 SURGERY — ESOPHAGOGASTRODUODENOSCOPY (EGD) WITH PROPOFOL
Anesthesia: General

## 2021-06-21 MED ORDER — EPHEDRINE 5 MG/ML INJ
INTRAVENOUS | Status: AC
  Filled 2021-06-21: qty 5

## 2021-06-21 MED ORDER — PROPOFOL 10 MG/ML IV BOLUS
INTRAVENOUS | Status: DC | PRN
  Administered 2021-06-21: 20 mg via INTRAVENOUS
  Administered 2021-06-21: 50 mg via INTRAVENOUS
  Administered 2021-06-21: 20 mg via INTRAVENOUS
  Administered 2021-06-21: 50 mg via INTRAVENOUS
  Administered 2021-06-21: 20 mg via INTRAVENOUS
  Administered 2021-06-21: 30 mg via INTRAVENOUS
  Administered 2021-06-21: 20 mg via INTRAVENOUS
  Administered 2021-06-21: 50 mg via INTRAVENOUS
  Administered 2021-06-21: 30 mg via INTRAVENOUS
  Administered 2021-06-21: 100 mg via INTRAVENOUS

## 2021-06-21 MED ORDER — LIDOCAINE HCL (PF) 2 % IJ SOLN
INTRAMUSCULAR | Status: AC
  Filled 2021-06-21: qty 10

## 2021-06-21 MED ORDER — EPHEDRINE SULFATE-NACL 50-0.9 MG/10ML-% IV SOSY
PREFILLED_SYRINGE | INTRAVENOUS | Status: DC | PRN
  Administered 2021-06-21 (×4): 5 mg via INTRAVENOUS

## 2021-06-21 MED ORDER — OMEPRAZOLE 40 MG PO CPDR: 40.0000 mg | DELAYED_RELEASE_CAPSULE | Freq: Two times a day (BID) | ORAL | 1 refills | Status: DC

## 2021-06-21 MED ORDER — LACTATED RINGERS IV SOLN: INTRAVENOUS | Status: DC | PRN

## 2021-06-21 MED ORDER — LIDOCAINE HCL (CARDIAC) PF 100 MG/5ML IV SOSY
PREFILLED_SYRINGE | INTRAVENOUS | Status: DC | PRN
  Administered 2021-06-21: 100 mg via INTRAVENOUS

## 2021-06-21 MED ORDER — LACTATED RINGERS IV SOLN: INTRAVENOUS | Status: DC

## 2021-06-21 NOTE — Anesthesia Procedure Notes (Signed)
Date/Time: 06/21/2021 9:39 AM ?Performed by: Sharee Pimple, CRNA ?Pre-anesthesia Checklist: Patient identified, Emergency Drugs available, Suction available and Patient being monitored ?Patient Re-evaluated:Patient Re-evaluated prior to induction ?Oxygen Delivery Method: Nasal cannula ?Induction Type: IV induction ?Placement Confirmation: positive ETCO2 ? ? ? ? ?

## 2021-06-21 NOTE — Transfer of Care (Signed)
Immediate Anesthesia Transfer of Care Note ? ?Patient: Joshua Hale. ? ?Procedure(s) Performed: ESOPHAGOGASTRODUODENOSCOPY (EGD) WITH PROPOFOL ?COLONOSCOPY WITH PROPOFOL ?BIOPSY ?POLYPECTOMY ? ?Patient Location: PACU ? ?Anesthesia Type:MAC ? ?Level of Consciousness: awake, alert  and oriented ? ?Airway & Oxygen Therapy: Patient Spontanous Breathing ? ?Post-op Assessment: Report given to RN and Post -op Vital signs reviewed and stable ? ?Post vital signs: Reviewed and stable ? ?Last Vitals:  ?Vitals Value Taken Time  ?BP 113/86 06/21/21 1034  ?Temp 37.4 ?C 06/21/21 1034  ?Pulse 81 06/21/21 1027  ?Resp 13 06/21/21 1027  ?SpO2 95 % 06/21/21 1034  ? ? ?Last Pain:  ?Vitals:  ? 06/21/21 1027  ?TempSrc: Oral  ?PainSc: 5   ?   ? ?Patients Stated Pain Goal: 10 (06/21/21 1194) ? ?Complications: No notable events documented. ?

## 2021-06-21 NOTE — Anesthesia Preprocedure Evaluation (Signed)
Anesthesia Evaluation  ?Patient identified by MRN, date of birth, ID band ?Patient awake ? ? ? ?Reviewed: ?Allergy & Precautions, H&P , NPO status , Patient's Chart, lab work & pertinent test results, reviewed documented beta blocker date and time  ? ?Airway ?Mallampati: II ? ?TM Distance: >3 FB ?Neck ROM: full ? ? ? Dental ?no notable dental hx. ? ?  ?Pulmonary ?neg pulmonary ROS, Current Smoker,  ?  ?Pulmonary exam normal ?breath sounds clear to auscultation ? ? ? ? ? ? Cardiovascular ?Exercise Tolerance: Good ?negative cardio ROS ? ? ?Rhythm:regular Rate:Normal ? ? ?  ?Neuro/Psych ?PSYCHIATRIC DISORDERS Anxiety Depression negative neurological ROS ?   ? GI/Hepatic ?negative GI ROS, Neg liver ROS,   ?Endo/Other  ?negative endocrine ROS ? Renal/GU ?negative Renal ROS  ?negative genitourinary ?  ?Musculoskeletal ? ? Abdominal ?  ?Peds ? Hematology ?negative hematology ROS ?(+)   ?Anesthesia Other Findings ? ? Reproductive/Obstetrics ?negative OB ROS ? ?  ? ? ? ? ? ? ? ? ? ? ? ? ? ?  ?  ? ? ? ? ? ? ? ? ?Anesthesia Physical ?Anesthesia Plan ? ?ASA: 2 ? ?Anesthesia Plan: General  ? ?Post-op Pain Management:   ? ?Induction:  ? ?PONV Risk Score and Plan: Propofol infusion ? ?Airway Management Planned:  ? ?Additional Equipment:  ? ?Intra-op Plan:  ? ?Post-operative Plan:  ? ?Informed Consent: I have reviewed the patients History and Physical, chart, labs and discussed the procedure including the risks, benefits and alternatives for the proposed anesthesia with the patient or authorized representative who has indicated his/her understanding and acceptance.  ? ? ? ?Dental Advisory Given ? ?Plan Discussed with: CRNA ? ?Anesthesia Plan Comments:   ? ? ? ? ? ? ?Anesthesia Quick Evaluation ? ?

## 2021-06-21 NOTE — Op Note (Addendum)
[facility] ?Patient Name: Joshua Hale ?Procedure Date: 06/21/2021 9:35 AM ?MRN: [id number] ?Date of Birth: 1971-02-11 ?Attending MD: Maylon Peppers ,  ?CSN: [id number] ?Age: 51 ?Admit Type: Outpatient ?Procedure:                Colonoscopy ?Indications:              Screening for colorectal malignant neoplasm,  ?                          unclear history of Crohn's disease ?Providers:                Maylon Peppers, Hughie Closs RN, RN, Crisann  ?                          Wynonia Lawman, Technician ?Referring MD:              ?Medicines:                Monitored Anesthesia Care ?Complications:            No immediate complications. ?Estimated Blood Loss:     Estimated blood loss: none. ?Procedure:                Pre-Anesthesia Assessment: ?                          - Prior to the procedure, a History and Physical  ?                          was performed, and patient medications, allergies  ?                          and sensitivities were reviewed. The patient's  ?                          tolerance of previous anesthesia was reviewed. ?                          - The risks and benefits of the procedure and the  ?                          sedation options and risks were discussed with the  ?                          patient. All questions were answered and informed  ?                          consent was obtained. ?                          - ASA Grade Assessment: III - A patient with severe  ?                          systemic disease. ?                          After obtaining informed consent, the colonoscope  ?  was passed under direct vision. Throughout the  ?                          procedure, the patient's blood pressure, pulse, and  ?                          oxygen saturations were monitored continuously. The  ?                          PCF-HQ190L ([id number]) scope was introduced through  ?                          the anus and advanced to the the terminal ileum.  ?                           The colonoscopy was performed without difficulty.  ?                          The patient tolerated the procedure well. The  ?                          quality of the bowel preparation was adequate to  ?                          identify polyps 6 mm and larger in size. ?Scope In: 9:38:28 AM ?Scope Out: 10:26:09 AM ?Scope Withdrawal Time: 0 hours 31 minutes 32 seconds  ?Total Procedure Duration: 0 hours 47 minutes 41 seconds  ?Findings: ?     The perianal exam findings include presence of scars ?possible previous  ?     drainage site. ?     A localized area of mucosa in the distal ileum was nodular. Biopsies  ?     were taken with a cold forceps for histology. The rest of the TI looked  ?     normal. ?     A 2 mm polyp was found in the cecum. The polyp was sessile. The polyp  ?     was removed with a cold snare. Resection and retrieval were complete. ?     The exam was otherwise normal throughout the examined colon. ?     The retroflexed view of the distal rectum and anal verge was normal and  ?     showed no anal or rectal abnormalities. ?Impression:               - Presence of scars ?possible previous drainage  ?                          site found on perianal exam. ?                          - Nodular ileal mucosa. Biopsied. ?                          - One 2 mm polyp in the cecum, removed with a cold  ?  snare. Resected and retrieved. ?                          - The distal rectum and anal verge are normal on  ?                          retroflexion view. ?Moderate Sedation: ?     Per Anesthesia Care ?Recommendation:           - Discharge patient to home (ambulatory). ?                          - Resume previous diet. ?                          - Await pathology results. ?                          - Schedule CT enterography. ?                          - Repeat colonoscopy in 5 years for surveillance. ?Procedure Code(s):        --- Professional --- ?                          [phone],  Colonoscopy, flexible; with removal of  ?                          tumor(s), polyp(s), or other lesion(s) by snare  ?                          technique ?                          45380, 59, Colonoscopy, flexible; with biopsy,  ?                          single or multiple ?Diagnosis Code(s):        --- Professional --- ?                          Z12.11, Encounter for screening for malignant  ?                          neoplasm of colon ?                          K63.89, Other specified diseases of intestine ?                          K63.5, Polyp of colon ?CPT copyright 2019 [employer]. All rights reserved. ?The codes documented in this report are preliminary and upon coder review may  ?be revised to meet current compliance requirements. ?Maylon Peppers, MD ?Maylon Peppers,  ?06/21/2021 10:35:10 AM ?This report has been signed electronically. ?Number of Addenda: 0 ?

## 2021-06-21 NOTE — Op Note (Signed)
[facility] ?Patient Name: Joshua Hale ?Procedure Date: 06/21/2021 9:11 AM ?MRN: [id number] ?Date of Birth: 1971/01/09 ?Attending MD: Maylon Peppers ,  ?CSN: [id number] ?Age: 51 ?Admit Type: Outpatient ?Procedure:                Upper GI endoscopy ?Indications:              Abdominal pain, Dysphagia, Early satiety ?Providers:                Maylon Peppers, Hughie Closs RN, RN, Crisann  ?                          Wynonia Lawman, Technician ?Referring MD:              ?Medicines:                Monitored Anesthesia Care ?Complications:            No immediate complications. ?Estimated Blood Loss:     Estimated blood loss: none. ?Procedure:                Pre-Anesthesia Assessment: ?                          - Prior to the procedure, a History and Physical  ?                          was performed, and patient medications, allergies  ?                          and sensitivities were reviewed. The patient's  ?                          tolerance of previous anesthesia was reviewed. ?                          - The risks and benefits of the procedure and the  ?                          sedation options and risks were discussed with the  ?                          patient. All questions were answered and informed  ?                          consent was obtained. ?                          - ASA Grade Assessment: III - A patient with severe  ?                          systemic disease. ?                          After obtaining informed consent, the endoscope was  ?                          passed under direct vision. Throughout the  ?  procedure, the patient's blood pressure, pulse, and  ?                          oxygen saturations were monitored continuously. The  ?                          [id number]-[id number] ([id number]) scope was introduced through the  ?                          mouth, and advanced to the second part of duodenum.  ?                          The upper GI endoscopy was accomplished without  ?                           difficulty. The patient tolerated the procedure  ?                          well. ?Scope In: 9:23:52 AM ?Scope Out: 9:32:16 AM ?Total Procedure Duration: 0 hours 8 minutes 24 seconds  ?Findings: ?     LA Grade D (one or more mucosal breaks involving at least 75% of  ?     esophageal circumference) esophagitis with no bleeding was found 30 to  ?     36 cm from the incisors. ?     A 2 cm hiatal hernia was present. ?     The entire examined stomach was normal. Biopsies were taken with a cold  ?     forceps for Helicobacter pylori testing. ?     The examined duodenum was normal. Biopsies were taken with a cold  ?     forceps for histology. ?Impression:               - LA Grade D reflux esophagitis with no bleeding. ?                          - 2 cm hiatal hernia. ?                          - Normal stomach. Biopsied. ?                          - Normal examined duodenum. Biopsied. ?Moderate Sedation: ?     Per Anesthesia Care ?Recommendation:           - Discharge patient to home (ambulatory). ?                          - Resume previous diet. ?                          - Repeat upper endoscopy in 3 months for  ?                          surveillance. ?                          - Continue omeprazole 40  mg twice a day compliantly. ?                          - Await pathology results. ?Procedure Code(s):        --- Professional --- ?                          [phone], Esophagogastroduodenoscopy, flexible,  ?                          transoral; with biopsy, single or multiple ?Diagnosis Code(s):        --- Professional --- ?                          K21.00, Gastro-esophageal reflux disease with  ?                          esophagitis, without bleeding ?                          K44.9, Diaphragmatic hernia without obstruction or  ?                          gangrene ?                          R10.9, Unspecified abdominal pain ?                          R13.10, Dysphagia, unspecified ?                           R68.81, Early satiety ?CPT copyright 2019 American Medical Association. All rights reserved. ?The codes documented in this report are preliminary and upon coder review may  ?be revised to meet current compliance requirements. ?Maylon Peppers, MD ?Maylon Peppers,  ?06/21/2021 10:30:07 AM ?This report has been signed electronically. ?Number of Addenda: 0 ?

## 2021-06-21 NOTE — Discharge Instructions (Addendum)
You are being discharged to home.  ?Resume your previous diet.  ?Your physician has recommended a repeat upper endoscopy in three months for surveillance.  ?We are waiting for your pathology results.  ?Continue omeprazole 40 mg twice a day compliantly. ?Your physician has recommended a repeat colonoscopy for surveillance based on pathology results.  ?Schedule CT enterography. ? ? ? ?OFFICE NOTIFIED VIA VOICEMAIL , LEANNE ROBERTS VOICEMAIL, THAT PATIENT NEEDS CT AND REPEAT EGD.  ?

## 2021-06-21 NOTE — H&P (Signed)
Joshua Hale. is an 51 y.o. male.   ?Chief Complaint: dysphagia, abdominal pain and CRC screening ?HPI: [name], [name], who came to the hospital for evaluation of dysphagia, abdominal pain and colorectal cancer screening. ? ?The patient has presented dysphagia to solids and liquids chronically along with recurrent episodes of heartburn and abdominal pain in his upper abdomen.  No recent abdominal imaging available. ? ?Had an unclear history of Crohn's disease diagnosis based on imaging in the early 2000's but no previous colonoscopies.  No family history of colorectal cancer. ? ?Past Medical History:  ?Diagnosis Date  ? Crohn disease ([place])   ? Depression   ? Heart murmur   ? Post traumatic stress disorder (PTSD)   ? ? ?Past Surgical History:  ?Procedure Laterality Date  ? ABDOMINAL SURGERY    ? stents  ? CHOLECYSTECTOMY    ? KNEE SURGERY    ? thumb surgery    ? ? ?Family History  ?Problem Relation Age of Onset  ? Hypertension Mother   ? Heart failure Mother   ? COPD Mother   ? ?Social History:  reports that he has been smoking cigarettes. He has been smoking an average of 2 packs per day. He has never used smokeless tobacco. He reports that he does not currently use alcohol after a past usage of about 84.0 standard drinks per week. He reports current drug use. Frequency: 2.00 times per week. Drug: Marijuana. ? ?Allergies:  ?Allergies  ?Allergen Reactions  ? Baclofen Swelling  ? Fentanyl Hives  ? Onion Swelling  ? Ultram [Tramadol] Nausea Only  ? ? ?Medications Prior to Admission  ?Medication Sig Dispense Refill  ? busPIRone (BUSPAR) 15 MG tablet Take 15 mg by mouth 3 (three) times daily.    ? diazepam (VALIUM) 10 MG tablet Take 10 mg by mouth daily. And take 2 tablets at bedtime    ? magnesium 30 MG tablet Take 30 mg by mouth daily at 6 (six) AM.    ? omeprazole (PRILOSEC) 40 MG capsule Take 1 capsule (40 mg total) by mouth 2 (two) times  daily. 60 capsule 1  ? potassium chloride SA (KLOR-CON M) 20 MEQ tablet Take 20 mEq by mouth daily.    ? sucralfate (CARAFATE) 1 GM/10ML suspension Take 10 mLs (1 g total) by mouth 4 (four) times daily -  with meals and at bedtime. 420 mL 1  ? ondansetron (ZOFRAN-ODT) 4 MG disintegrating tablet Take 4 mg by mouth every 8 (eight) hours as needed.    ? polyethylene glycol-electrolytes (TRILYTE) 420 g solution Take 4,000 mLs by mouth as directed. 4000 mL 0  ? ? ?No results found for this or any previous visit (from the past 48 hour(s)). ?No results found. ? ?Review of Systems  ?Constitutional: Negative.   ?HENT:  Positive for trouble swallowing.   ?Eyes: Negative.   ?Respiratory: Negative.    ?Cardiovascular: Negative.   ?Gastrointestinal:  Positive for abdominal pain.  ?Endocrine: Negative.   ?Genitourinary: Negative.   ?Musculoskeletal: Negative.   ?Skin: Negative.   ?Allergic/Immunologic: Negative.   ?Neurological: Negative.   ?Hematological: Negative.   ?Psychiatric/Behavioral: Negative.    ? ?Blood pressure 116/81, temperature 98.2 ?F (36.8 ?C), temperature source Oral, resp. rate 10, height '5\' 5"'$  (1.651 m), weight 61.2 kg, SpO2 97 %. ?Physical Exam  ?GENERAL: The patient is AO x3, in no acute distress. ?HEENT: Head is normocephalic and atraumatic. EOMI  are intact. Mouth is well hydrated and without lesions. ?NECK: Supple. No masses ?LUNGS: Clear to auscultation. No presence of rhonchi/wheezing/rales. Adequate chest expansion ?HEART: RRR, normal s1 and s2. ?ABDOMEN: Soft, nontender, no guarding, no peritoneal signs, and nondistended. BS +. No masses. ?EXTREMITIES: Without any cyanosis, clubbing, rash, lesions or edema. ?NEUROLOGIC: AOx3, no focal motor deficit. ?SKIN: no jaundice, no rashes ? ?Assessment/Plan ?[name], [name], who came to the hospital for evaluation of dysphagia, abdominal pain and colorectal cancer screening.  We will  proceed with EGD and colonoscopy. ? ?Joshua Quale, MD ?06/21/2021, 8:37 AM ? ? ? ?

## 2021-06-22 NOTE — Anesthesia Postprocedure Evaluation (Signed)
Anesthesia Post Note ? ?Patient: Joshua Hale. ? ?Procedure(s) Performed: ESOPHAGOGASTRODUODENOSCOPY (EGD) WITH PROPOFOL ?COLONOSCOPY WITH PROPOFOL ?BIOPSY ?POLYPECTOMY ? ?Patient location during evaluation: Phase II ?Anesthesia Type: General ?Level of consciousness: awake ?Pain management: pain level controlled ?Vital Signs Assessment: post-procedure vital signs reviewed and stable ?Respiratory status: spontaneous breathing and respiratory function stable ?Cardiovascular status: blood pressure returned to baseline and stable ?Postop Assessment: no headache and no apparent nausea or vomiting ?Anesthetic complications: no ?Comments: Late entry ? ? ?No notable events documented. ? ? ?Last Vitals:  ?Vitals:  ? 06/21/21 1027 06/21/21 1034  ?BP: 114/80 113/86  ?Pulse: 81   ?Resp: 13   ?Temp: (!) 36.4 ?C 37.4 ?C  ?SpO2: 97% 95%  ?  ?Last Pain:  ?Vitals:  ? 06/21/21 1027  ?TempSrc: Oral  ?PainSc: 5   ? ? ?  ?  ?  ?  ?  ?  ? ?Louann Sjogren ? ? ? ? ?

## 2021-06-24 ENCOUNTER — Encounter (INDEPENDENT_AMBULATORY_CARE_PROVIDER_SITE_OTHER): Payer: Self-pay | Admitting: *Deleted

## 2021-06-25 ENCOUNTER — Other Ambulatory Visit (INDEPENDENT_AMBULATORY_CARE_PROVIDER_SITE_OTHER): Payer: Self-pay

## 2021-06-25 DIAGNOSIS — R6881 Early satiety: Secondary | ICD-10-CM

## 2021-06-25 DIAGNOSIS — G8929 Other chronic pain: Secondary | ICD-10-CM

## 2021-06-26 ENCOUNTER — Other Ambulatory Visit (INDEPENDENT_AMBULATORY_CARE_PROVIDER_SITE_OTHER): Payer: Self-pay | Admitting: Gastroenterology

## 2021-06-26 ENCOUNTER — Telehealth (INDEPENDENT_AMBULATORY_CARE_PROVIDER_SITE_OTHER): Payer: Self-pay | Admitting: Gastroenterology

## 2021-06-26 NOTE — Telephone Encounter (Signed)
Patient's wife called as the pharmacy could not find a refill for the Carafate but she stated that "they actually found one refill".  She does not have normal questions about the Carafate. ?

## 2021-06-26 NOTE — Telephone Encounter (Signed)
Last office visit 06/10/21 ?

## 2021-06-26 NOTE — Telephone Encounter (Signed)
Patients spouse left a voice mail stating he has questions regarding Carafate liquid - please advise - ph# [phone] ?

## 2021-06-27 ENCOUNTER — Encounter (HOSPITAL_COMMUNITY): Payer: Self-pay | Admitting: Gastroenterology

## 2021-06-27 ENCOUNTER — Other Ambulatory Visit (INDEPENDENT_AMBULATORY_CARE_PROVIDER_SITE_OTHER): Payer: Self-pay | Admitting: Gastroenterology

## 2021-06-27 LAB — SURGICAL PATHOLOGY

## 2021-06-27 MED ORDER — ONDANSETRON 4 MG PO TBDP: 4.0000 mg | ORAL_TABLET | Freq: Three times a day (TID) | ORAL | 1 refills | Status: DC | PRN

## 2021-06-28 ENCOUNTER — Encounter (INDEPENDENT_AMBULATORY_CARE_PROVIDER_SITE_OTHER): Payer: Self-pay | Admitting: *Deleted

## 2021-07-02 ENCOUNTER — Ambulatory Visit (HOSPITAL_COMMUNITY): Payer: Medicare HMO

## 2021-07-02 DIAGNOSIS — F10229 Alcohol dependence with intoxication, unspecified: Secondary | ICD-10-CM | POA: Diagnosis not present

## 2021-07-02 DIAGNOSIS — G40919 Epilepsy, unspecified, intractable, without status epilepticus: Secondary | ICD-10-CM | POA: Diagnosis not present

## 2021-07-02 DIAGNOSIS — F411 Generalized anxiety disorder: Secondary | ICD-10-CM | POA: Diagnosis not present

## 2021-07-02 DIAGNOSIS — I10 Essential (primary) hypertension: Secondary | ICD-10-CM | POA: Diagnosis not present

## 2021-07-02 DIAGNOSIS — Z6824 Body mass index (BMI) 24.0-24.9, adult: Secondary | ICD-10-CM | POA: Diagnosis not present

## 2021-07-02 DIAGNOSIS — Z0000 Encounter for general adult medical examination without abnormal findings: Secondary | ICD-10-CM | POA: Diagnosis not present

## 2021-07-11 ENCOUNTER — Ambulatory Visit (HOSPITAL_COMMUNITY)
Admission: RE | Admit: 2021-07-11 | Discharge: 2021-07-11 | Disposition: A | Discharge status: Home / Self Care | Payer: Medicare HMO | Source: Ambulatory Visit | Attending: Gastroenterology | Admitting: Gastroenterology

## 2021-07-11 DIAGNOSIS — R1110 Vomiting, unspecified: Secondary | ICD-10-CM | POA: Diagnosis not present

## 2021-07-11 DIAGNOSIS — G8929 Other chronic pain: Secondary | ICD-10-CM | POA: Insufficient documentation

## 2021-07-11 DIAGNOSIS — I700 Atherosclerosis of aorta: Secondary | ICD-10-CM | POA: Diagnosis not present

## 2021-07-11 DIAGNOSIS — R1013 Epigastric pain: Secondary | ICD-10-CM | POA: Insufficient documentation

## 2021-07-11 DIAGNOSIS — R6881 Early satiety: Secondary | ICD-10-CM | POA: Diagnosis not present

## 2021-07-11 DIAGNOSIS — R109 Unspecified abdominal pain: Secondary | ICD-10-CM | POA: Diagnosis not present

## 2021-07-11 MED ORDER — SODIUM CHLORIDE (PF) 0.9 % IJ SOLN
INTRAMUSCULAR | Status: AC
  Filled 2021-07-11: qty 50

## 2021-07-11 MED ORDER — BARIUM SULFATE 0.1 % PO SUSP
ORAL | Status: AC
  Filled 2021-07-11: qty 3

## 2021-07-11 MED ORDER — IOHEXOL 300 MG/ML  SOLN
100.0000 mL | Freq: Once | INTRAMUSCULAR | Status: AC | PRN
  Administered 2021-07-11: 80 mL via INTRAVENOUS

## 2021-07-22 ENCOUNTER — Encounter (INDEPENDENT_AMBULATORY_CARE_PROVIDER_SITE_OTHER): Payer: Self-pay

## 2021-07-24 ENCOUNTER — Other Ambulatory Visit (INDEPENDENT_AMBULATORY_CARE_PROVIDER_SITE_OTHER): Payer: Self-pay | Admitting: Gastroenterology

## 2021-07-24 DIAGNOSIS — R112 Nausea with vomiting, unspecified: Secondary | ICD-10-CM

## 2021-08-05 DIAGNOSIS — M25562 Pain in left knee: Secondary | ICD-10-CM | POA: Diagnosis not present

## 2021-08-05 DIAGNOSIS — F419 Anxiety disorder, unspecified: Secondary | ICD-10-CM | POA: Diagnosis not present

## 2021-08-05 DIAGNOSIS — G43019 Migraine without aura, intractable, without status migrainosus: Secondary | ICD-10-CM | POA: Diagnosis not present

## 2021-08-05 DIAGNOSIS — G4700 Insomnia, unspecified: Secondary | ICD-10-CM | POA: Diagnosis not present

## 2021-08-05 DIAGNOSIS — M792 Neuralgia and neuritis, unspecified: Secondary | ICD-10-CM | POA: Diagnosis not present

## 2021-08-05 DIAGNOSIS — R569 Unspecified convulsions: Secondary | ICD-10-CM | POA: Diagnosis not present

## 2021-08-05 DIAGNOSIS — M545 Low back pain, unspecified: Secondary | ICD-10-CM | POA: Diagnosis not present

## 2021-08-05 DIAGNOSIS — M79662 Pain in left lower leg: Secondary | ICD-10-CM | POA: Diagnosis not present

## 2021-08-15 ENCOUNTER — Other Ambulatory Visit: Payer: Self-pay | Admitting: Neurology

## 2021-08-15 ENCOUNTER — Other Ambulatory Visit (HOSPITAL_COMMUNITY): Payer: Self-pay | Admitting: Neurology

## 2021-08-15 DIAGNOSIS — R569 Unspecified convulsions: Secondary | ICD-10-CM

## 2021-08-19 ENCOUNTER — Ambulatory Visit (INDEPENDENT_AMBULATORY_CARE_PROVIDER_SITE_OTHER): Payer: Medicare HMO | Admitting: Gastroenterology

## 2021-08-20 ENCOUNTER — Encounter (INDEPENDENT_AMBULATORY_CARE_PROVIDER_SITE_OTHER): Payer: Self-pay | Admitting: Gastroenterology

## 2021-08-20 ENCOUNTER — Ambulatory Visit (INDEPENDENT_AMBULATORY_CARE_PROVIDER_SITE_OTHER): Payer: Medicare HMO | Admitting: Gastroenterology

## 2021-08-20 VITALS — BP 116/81 | HR 94 | Temp 97.6°F | Ht 66.0 in | Wt 151.1 lb

## 2021-08-20 DIAGNOSIS — R112 Nausea with vomiting, unspecified: Secondary | ICD-10-CM | POA: Diagnosis not present

## 2021-08-20 DIAGNOSIS — R1013 Epigastric pain: Secondary | ICD-10-CM

## 2021-08-20 DIAGNOSIS — G8929 Other chronic pain: Secondary | ICD-10-CM

## 2021-08-20 DIAGNOSIS — K2100 Gastro-esophageal reflux disease with esophagitis, without bleeding: Secondary | ICD-10-CM | POA: Diagnosis not present

## 2021-08-20 DIAGNOSIS — R070 Pain in throat: Secondary | ICD-10-CM

## 2021-08-20 MED ORDER — SUCRALFATE 1 GM/10ML PO SUSP: ORAL | 1 refills | Status: DC

## 2021-08-20 MED ORDER — ONDANSETRON 4 MG PO TBDP: 4.0000 mg | ORAL_TABLET | Freq: Three times a day (TID) | ORAL | 1 refills | Status: DC | PRN

## 2021-08-20 MED ORDER — ESOMEPRAZOLE MAGNESIUM 40 MG PO CPDR: 40.0000 mg | DELAYED_RELEASE_CAPSULE | Freq: Two times a day (BID) | ORAL | 1 refills | Status: DC

## 2021-08-20 NOTE — Patient Instructions (Signed)
Please stop omeprazole ?I have sent nexium '40mg'$  to your pharmacy ?Please take this 30 minutes prior to breakfast and 30 minutes prior to dinner ?Avoid greasy, spicy, fried, citrus foods, and be mindful that caffeine, carbonated drinks, chocolate and alcohol. ?Stay upright 2-3 hours after eating, prior to lying down and avoid eating late in the evenings. ?Please avoid NSAIDs (advil, aleve, naproxen, goody powder, ibuprofen) ?We will get you scheduled for repeat EGD ?Please continue carafate 4 times per day, before meals and at bedtime, refill of this and zofran for nausea sent to your pharmacy. Make sure you are drinking plenty of water and having plenty of fruits and veggies in your diet to avoid constipation ? ?Follow up 4 months ?

## 2021-08-20 NOTE — Progress Notes (Signed)
? ?Referring Provider: Neale Burly, MD ?Primary Care Physician:  Neale Burly, MD ?Primary GI Physician: castaneda ? ?Chief Complaint  ?Patient presents with  ? Follow-up  ?  Patient here for a follow up visit. He is still having stomach pain and burning in stomach and up esophagus.  ? ?HPI:   ?Joshua Hale. is a 51 y.o. male with past medical history of PTSD, GERD, gastritis, esophagitis.  ? ?Patient presenting today for follow up of epigastric and stomach pain. ? ?Patient last seen 05/21/21 with mid epigastric pain/burning to throat and stomach. Reported acid reflux symptoms for some time, though worse over the past year, using tums previously but having no relief from those at time of visit. Having dysphagia with solids and pills and pressure in his chest. Chronic diarrhea with 3 episodes per day, started on omeprazole '40mg'$  daily and carafage 1g QID by PCP without Improvement. Pt denied ETOH since December 2022 and reported rare NSAID use. PPI increased to BID, carafate 1g suspension QID, pt scheduled for EGD and Colonoscopy, findings as outlined below.  ? ?States that he is still having a lot abdominal pain/burning, states he is not eating much due to decreased appetite, however he has actually gained some weight.  still having burning in his throat/stomach and dysphagia with anything he eats. States that he has a BM every other day and does not feel that he has constipation, despite CT at end of March suggesting constipation. Still having some nausea and vomiting on occasion, sometimes has some blood in his emesis at times. States he has not drank any alcohol since December, reports he is only using advil on rare occasion.  Reports compliance with his PPI and taking carafate QID. Trying to eat a bland diet.  ? ?CT Entero Abd/Pelvis W contrast ?1. No evidence of active enteritis or Crohn disease. ?2. Solid, stool like material within the distal ileum, suggesting ?decreased bowel  transit/constipation. ?3. Moderate hiatal hernia with distal esophageal and herniated ?stomach wall thickening, suspicious for esophagitis and/or ?gastritis. ?4. Aortic Atherosclerosis (ICD10-I70.0). This is significantly age ?advanced. ?5. Bilateral nephrolithiasis. ?Last Colonoscopy:06/21/21- Presence of scars ?possible previous drainage site found on perianal exam. ?- Nodular ileal mucosa. Biopsied-normal ?- One 2 mm polyp in the cecum-normal tissue ?- The distal rectum and anal verge are normal on retroflexion view. ? ?Last Endoscopy: 06/21/21- LA Grade D reflux esophagitis with no bleeding. ?- 2 cm hiatal hernia. ?- Normal stomach. Biopsied-chronic gastritis ?- Normal examined duodenum. Biopsied-normal ? ?Recommendations:  ?Repeat colonoscopy in march 2033 ?Repeat EGD in June 2023 ? ?Past Medical History:  ?Diagnosis Date  ? Crohn disease ([place])   ? Depression   ? Heart murmur   ? Post traumatic stress disorder (PTSD)   ? ? ?Past Surgical History:  ?Procedure Laterality Date  ? ABDOMINAL SURGERY    ? stents  ? BIOPSY  06/21/2021  ? Procedure: BIOPSY;  Surgeon: Harvel Quale, MD;  [facility];  Service: Gastroenterology;;  ? CHOLECYSTECTOMY    ? COLONOSCOPY WITH PROPOFOL N/A 06/21/2021  ? Procedure: COLONOSCOPY WITH PROPOFOL;  Surgeon: Harvel Quale, MD;  [facility];  Service: Gastroenterology;  Laterality: N/A;  ? ESOPHAGOGASTRODUODENOSCOPY (EGD) WITH PROPOFOL N/A 06/21/2021  ? Procedure: ESOPHAGOGASTRODUODENOSCOPY (EGD) WITH PROPOFOL;  Surgeon: Harvel Quale, MD;  [facility];  Service: Gastroenterology;  Laterality: N/A;  945  ? KNEE SURGERY    ? POLYPECTOMY  06/21/2021  ? Procedure: POLYPECTOMY;  Surgeon: Montez Morita, Quillian Quince, MD;  [facility]: [facility];  Service: Gastroenterology;;  ? thumb surgery    ? ? ?Current Outpatient Medications  ?Medication Sig Dispense Refill  ? busPIRone (BUSPAR) 15 MG tablet Take 15 mg by mouth 3 (three)  times daily.    ? diazepam (VALIUM) 10 MG tablet Take 10 mg by mouth daily. And take 2 tablets at bedtime    ? divalproex (DEPAKOTE) 250 MG DR tablet Take 250 mg by mouth 2 (two) times daily.    ? magnesium 30 MG tablet Take 30 mg by mouth daily at 6 (six) AM.    ? omeprazole (PRILOSEC) 40 MG capsule Take 1 capsule (40 mg total) by mouth 2 (two) times daily. 180 capsule 1  ? ondansetron (ZOFRAN-ODT) 4 MG disintegrating tablet DISSOLVE ONE TABLET BY MOUTH EVERY 8 HOURS AS NEEDED FOR VOMITING OR NAUSEA. 60 tablet 1  ? potassium chloride SA (KLOR-CON M) 20 MEQ tablet Take 20 mEq by mouth daily.    ? sucralfate (CARAFATE) 1 GM/10ML suspension TAKE TWO TEASPOONSFUL (10ML) BY MOUTH FOUR TIMES DAILY WITH MEALS AND AT BEDTIME 420 mL 1  ? ?No current facility-administered medications for this visit.  ? ? ?Allergies as of 08/20/2021 - Review Complete 08/20/2021  ?Allergen Reaction Noted  ? Baclofen Swelling 11/30/2014  ? Fentanyl Hives 11/21/2010  ? Onion Swelling 11/21/2010  ? Ultram [tramadol] Nausea Only 12/02/2011  ? ? ?Family History  ?Problem Relation Age of Onset  ? Hypertension Mother   ? Heart failure Mother   ? COPD Mother   ? ? ?Social History  ? ?Socioeconomic History  ? Marital status: Married  ?  Spouse name: Not on file  ? Number of children: Not on file  ? Years of education: Not on file  ? Highest education level: Not on file  ?Occupational History  ? Not on file  ?Tobacco Use  ? Smoking status: Every Day  ?  Packs/day: 2.00  ?  Types: Cigarettes  ? Smokeless tobacco: Never  ?Vaping Use  ? Vaping Use: Never used  ?Substance and Sexual Activity  ? Alcohol use: Not Currently  ?  Alcohol/week: 84.0 standard drinks  ?  Types: 84 Cans of beer per week  ?  Comment: nothing since January 2023  ? Drug use: Yes  ?  Frequency: 2.0 times per week  ?  Types: Marijuana  ?  Comment: last used 06/19/21  ? Sexual activity: Yes  ?Other Topics Concern  ? Not on file  ?Social History Narrative  ? Not on file  ? ?Social  Determinants of Health  ? ?Financial Resource Strain: Not on file  ?Food Insecurity: Not on file  ?Transportation Needs: Not on file  ?Physical Activity: Not on file  ?Stress: Not on file  ?Social Connections: Not on file  ? ?Review of systems ?General: negative for malaise, night sweats, fever, chills, weight loss ?Neck: Negative for lumps, goiter, pain and significant neck swelling ?Resp: Negative for cough, wheezing, dyspnea at rest ?CV: Negative for chest pain, leg swelling, palpitations, orthopnea ?GI: denies melena, hematochezia, vomiting, diarrhea, constipation, odynophagia, early satiety or unintentional weight loss. +burning to throat +burning to stomach +dysphagia +nausea ?MSK: Negative for joint pain or swelling, back pain, and muscle pain. ?Derm: Negative for itching or rash ?Psych: Denies depression, anxiety, memory loss, confusion. No homicidal or suicidal ideation.  ?Heme: Negative for prolonged bleeding, bruising easily, and swollen nodes. ?Endocrine: Negative for cold or heat intolerance, polyuria, polydipsia and goiter. ?  Neuro: negative for tremor, gait imbalance, syncope and seizures. ?The remainder of the review of systems is noncontributory. ? ?Physical Exam: ?BP 116/81 (BP Location: Right Arm, Patient Position: Sitting, Cuff Size: Small)   Pulse 94   Temp 97.6 ?F (36.4 ?C) (Oral)   Ht '5\' 6"'$  (1.676 m)   Wt 151 lb 1.6 oz (68.5 kg)   BMI 24.39 kg/m?  ?General:   Alert and oriented. No distress noted. Pleasant and cooperative.  ?Head:  Normocephalic and atraumatic. ?Eyes:  Conjuctiva clear without scleral icterus. ?Mouth:  Oral mucosa pink and moist. Good dentition. No lesions. ?Heart: Normal rate and rhythm, s1 and s2 heart sounds present.  ?Lungs: Clear lung sounds in all lobes. Respirations equal and unlabored. ?Abdomen:  +BS, soft, non-tender and non-distended. No rebound or guarding. No HSM or masses noted. ?Derm: No palmar erythema or jaundice ?Msk:  Symmetrical without gross  deformities. Normal posture. ?Extremities:  Without edema. ?Neurologic:  Alert and  oriented x4 ?Psych:  Alert and cooperative. Normal mood and affect. ? ?Invalid input(s): 6 MONTHS  ? ?ASSESSMENT: ?Joshua Hale. is a 51 y.o. male presenting today f

## 2021-08-30 ENCOUNTER — Ambulatory Visit (HOSPITAL_COMMUNITY): Payer: Medicare HMO

## 2021-09-12 ENCOUNTER — Encounter (INDEPENDENT_AMBULATORY_CARE_PROVIDER_SITE_OTHER): Payer: Self-pay

## 2021-09-12 ENCOUNTER — Other Ambulatory Visit (INDEPENDENT_AMBULATORY_CARE_PROVIDER_SITE_OTHER): Payer: Self-pay

## 2021-09-12 DIAGNOSIS — K2100 Gastro-esophageal reflux disease with esophagitis, without bleeding: Secondary | ICD-10-CM

## 2021-09-12 DIAGNOSIS — R1310 Dysphagia, unspecified: Secondary | ICD-10-CM

## 2021-09-17 ENCOUNTER — Ambulatory Visit (HOSPITAL_COMMUNITY)
Admission: RE | Admit: 2021-09-17 | Discharge: 2021-09-17 | Disposition: A | Discharge status: Home / Self Care | Payer: Medicare HMO | Source: Ambulatory Visit | Attending: Neurology | Admitting: Neurology

## 2021-09-17 DIAGNOSIS — R531 Weakness: Secondary | ICD-10-CM | POA: Diagnosis not present

## 2021-09-17 DIAGNOSIS — R569 Unspecified convulsions: Secondary | ICD-10-CM | POA: Insufficient documentation

## 2021-09-17 MED ORDER — GADOBUTROL 1 MMOL/ML IV SOLN
7.0000 mL | Freq: Once | INTRAVENOUS | Status: AC | PRN
  Administered 2021-09-17: 7 mL via INTRAVENOUS

## 2021-09-19 ENCOUNTER — Other Ambulatory Visit (INDEPENDENT_AMBULATORY_CARE_PROVIDER_SITE_OTHER): Payer: Self-pay | Admitting: Gastroenterology

## 2021-09-19 DIAGNOSIS — R112 Nausea with vomiting, unspecified: Secondary | ICD-10-CM

## 2021-09-19 NOTE — Telephone Encounter (Signed)
Seen 08/20/21 

## 2021-10-02 DIAGNOSIS — Z6824 Body mass index (BMI) 24.0-24.9, adult: Secondary | ICD-10-CM | POA: Diagnosis not present

## 2021-10-02 DIAGNOSIS — M79641 Pain in right hand: Secondary | ICD-10-CM | POA: Diagnosis not present

## 2021-10-02 DIAGNOSIS — I10 Essential (primary) hypertension: Secondary | ICD-10-CM | POA: Diagnosis not present

## 2021-10-02 DIAGNOSIS — G40919 Epilepsy, unspecified, intractable, without status epilepticus: Secondary | ICD-10-CM | POA: Diagnosis not present

## 2021-10-02 DIAGNOSIS — Z0000 Encounter for general adult medical examination without abnormal findings: Secondary | ICD-10-CM | POA: Diagnosis not present

## 2021-10-02 DIAGNOSIS — F411 Generalized anxiety disorder: Secondary | ICD-10-CM | POA: Diagnosis not present

## 2021-10-08 ENCOUNTER — Encounter (INDEPENDENT_AMBULATORY_CARE_PROVIDER_SITE_OTHER): Payer: Self-pay

## 2021-10-24 ENCOUNTER — Ambulatory Visit: Payer: Medicare HMO | Admitting: Orthopaedic Surgery

## 2021-10-30 ENCOUNTER — Ambulatory Visit: Payer: Medicare HMO | Admitting: Neurology

## 2021-11-02 DIAGNOSIS — R569 Unspecified convulsions: Secondary | ICD-10-CM | POA: Diagnosis not present

## 2021-11-02 DIAGNOSIS — S161XXA Strain of muscle, fascia and tendon at neck level, initial encounter: Secondary | ICD-10-CM | POA: Diagnosis not present

## 2021-11-02 DIAGNOSIS — B349 Viral infection, unspecified: Secondary | ICD-10-CM | POA: Diagnosis not present

## 2021-11-02 DIAGNOSIS — K047 Periapical abscess without sinus: Secondary | ICD-10-CM | POA: Diagnosis not present

## 2021-11-02 DIAGNOSIS — Z885 Allergy status to narcotic agent status: Secondary | ICD-10-CM | POA: Diagnosis not present

## 2021-11-02 DIAGNOSIS — Z886 Allergy status to analgesic agent status: Secondary | ICD-10-CM | POA: Diagnosis not present

## 2021-11-02 DIAGNOSIS — G40909 Epilepsy, unspecified, not intractable, without status epilepticus: Secondary | ICD-10-CM | POA: Diagnosis not present

## 2021-11-02 DIAGNOSIS — Z91018 Allergy to other foods: Secondary | ICD-10-CM | POA: Diagnosis not present

## 2021-11-02 DIAGNOSIS — H6690 Otitis media, unspecified, unspecified ear: Secondary | ICD-10-CM | POA: Diagnosis not present

## 2021-11-02 DIAGNOSIS — F17210 Nicotine dependence, cigarettes, uncomplicated: Secondary | ICD-10-CM | POA: Diagnosis not present

## 2021-11-04 ENCOUNTER — Encounter (HOSPITAL_COMMUNITY)
Admission: RE | Admit: 2021-11-04 | Discharge: 2021-11-04 | Disposition: A | Discharge status: Home / Self Care | Payer: Medicare HMO | Source: Ambulatory Visit | Attending: Gastroenterology | Admitting: Gastroenterology

## 2021-11-06 ENCOUNTER — Other Ambulatory Visit (INDEPENDENT_AMBULATORY_CARE_PROVIDER_SITE_OTHER): Payer: Self-pay | Admitting: Gastroenterology

## 2021-11-06 DIAGNOSIS — Z6824 Body mass index (BMI) 24.0-24.9, adult: Secondary | ICD-10-CM | POA: Diagnosis not present

## 2021-11-06 DIAGNOSIS — G40919 Epilepsy, unspecified, intractable, without status epilepticus: Secondary | ICD-10-CM | POA: Diagnosis not present

## 2021-11-07 ENCOUNTER — Encounter (HOSPITAL_COMMUNITY): Admission: RE | Payer: Self-pay | Source: Ambulatory Visit

## 2021-11-07 ENCOUNTER — Ambulatory Visit (HOSPITAL_COMMUNITY): Admission: RE | Admit: 2021-11-07 | Payer: Medicare HMO | Source: Ambulatory Visit | Admitting: Gastroenterology

## 2021-11-07 SURGERY — ESOPHAGOGASTRODUODENOSCOPY (EGD) WITH PROPOFOL
Anesthesia: Monitor Anesthesia Care

## 2021-11-07 NOTE — Progress Notes (Signed)
No show, called patient and patient wife stated they will need to reschedule this procedure.  Patient will call office.

## 2021-11-07 NOTE — Telephone Encounter (Signed)
Seen 08/20/21

## 2021-11-11 ENCOUNTER — Encounter: Payer: Self-pay | Admitting: Neurology

## 2021-11-11 ENCOUNTER — Ambulatory Visit: Payer: Medicare HMO | Admitting: Neurology

## 2021-11-11 ENCOUNTER — Other Ambulatory Visit (INDEPENDENT_AMBULATORY_CARE_PROVIDER_SITE_OTHER): Payer: Self-pay

## 2021-11-11 ENCOUNTER — Telehealth: Payer: Self-pay | Admitting: Neurology

## 2021-11-11 ENCOUNTER — Encounter (INDEPENDENT_AMBULATORY_CARE_PROVIDER_SITE_OTHER): Payer: Self-pay

## 2021-11-11 DIAGNOSIS — K2100 Gastro-esophageal reflux disease with esophagitis, without bleeding: Secondary | ICD-10-CM

## 2021-11-11 DIAGNOSIS — R1310 Dysphagia, unspecified: Secondary | ICD-10-CM

## 2021-11-11 NOTE — Telephone Encounter (Signed)
Pt wife Joshua Hale) called and stated Pt missed appointment because they didn't have gas money to make the appointment.

## 2021-11-14 ENCOUNTER — Ambulatory Visit (INDEPENDENT_AMBULATORY_CARE_PROVIDER_SITE_OTHER): Payer: Medicare HMO | Admitting: Orthopaedic Surgery

## 2021-11-14 ENCOUNTER — Ambulatory Visit: Payer: Self-pay

## 2021-11-14 DIAGNOSIS — M79641 Pain in right hand: Secondary | ICD-10-CM

## 2021-11-14 DIAGNOSIS — M720 Palmar fascial fibromatosis [Dupuytren]: Secondary | ICD-10-CM | POA: Diagnosis not present
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
## 2021-11-26 ENCOUNTER — Encounter: Payer: Self-pay | Admitting: Neurology

## 2021-11-26 ENCOUNTER — Ambulatory Visit: Payer: Medicare HMO | Admitting: Neurology

## 2021-11-26 VITALS — BP 110/73 | HR 76 | Ht 66.0 in | Wt 154.0 lb

## 2021-11-26 DIAGNOSIS — G43701 Chronic migraine without aura, not intractable, with status migrainosus: Secondary | ICD-10-CM

## 2021-11-26 DIAGNOSIS — G40909 Epilepsy, unspecified, not intractable, without status epilepticus: Secondary | ICD-10-CM | POA: Diagnosis not present

## 2021-11-26 DIAGNOSIS — Z5181 Encounter for therapeutic drug level monitoring: Secondary | ICD-10-CM

## 2021-11-26 MED ORDER — AIMOVIG 140 MG/ML ~~LOC~~ SOAJ
140.0000 mg | SUBCUTANEOUS | 11 refills | Status: DC
Start: 2021-11-26 — End: 2022-12-23

## 2021-11-26 MED ORDER — DIVALPROEX SODIUM 250 MG PO DR TAB: 500.0000 mg | DELAYED_RELEASE_TABLET | Freq: Three times a day (TID) | ORAL | 6 refills | Status: DC

## 2021-11-26 NOTE — Progress Notes (Signed)
[facility]  PATIENT: Joshua Hale. DOB: 08/09/1970  REQUESTING CLINICIAN: Barton Fanny, NP HISTORY FROM: Patient and wife  REASON FOR VISIT: Seizure/Migraines headaches    HISTORICAL  CHIEF COMPLAINT:  Chief Complaint  Patient presents with   New Patient (Initial Visit)    Rm 13. Accompanied by wife, Olin Hauser. NP/Paper/[facility]/Ayeshia Florene Glen NP/migraines, seizures. C/o frequent full body shaking episodes, once or twice per day. C/o daily migraines. The week before his aimovig injection he has severe migraines daily.    HISTORY OF PRESENT ILLNESS:  This is a 51 year old man with PMHx including migraines headaches on Aimovig, Seizure disorder that started last November, and chronic pain syndrome who is presenting as a transfer of care. Patient reports that seizures started last November, described as generalized convulsion with drooling, tongue biting and urinary incontinence. He reports frequency improved after started Depakote but still having about one seizure per month. Reports history of head trauma including concussion, history of alcohol abuse but no other seizure risk factors.    Handedness: Rght handed   Onset: November of last year  Seizure Type: Generalized convulsion  Current frequency: Once a month   Any injuries from seizures: Tongue biting  Seizure risk factors: head trauma, quit drinking 7 months ago,   Previous ASMs: Depakote   Currenty ASMs: Depakote 500 mg BID   ASMs side effects: Denies   Brain Images: Normal Brain MRI   Previous EEGs: N/A   OTHER MEDICAL CONDITIONS: Chronic pain syndrome, Migraines  REVIEW OF SYSTEMS: Full 14 system review of systems performed and negative with exception of: As noted in the HPI   ALLERGIES: Allergies  Allergen Reactions   Baclofen Swelling   Fentanyl Hives   Onion Swelling   Oxycodone Other (See Comments)    Puts him in another world   Ultram [Tramadol] Nausea Only     HOME MEDICATIONS: Outpatient Medications Prior to Visit  Medication Sig Dispense Refill   busPIRone (BUSPAR) 15 MG tablet Take 15 mg by mouth 2 (two) times daily.     diazepam (VALIUM) 10 MG tablet Take 10 mg by mouth 2 (two) times daily.     doxepin (SINEQUAN) 10 MG capsule Take 10 mg by mouth at bedtime.     esomeprazole (NEXIUM) 40 MG capsule TAKE ONE CAPSULE BY MOUTH TWICE DAILY BEFORE A MEAL (Patient taking differently: Take 40 mg by mouth daily.) 60 capsule 1   Magnesium 400 MG TABS Take 400 mg by mouth daily at 6 (six) AM.     NAYZILAM 5 MG/0.1ML SOLN Place 1 spray into both nostrils daily as needed (Seizures).     ondansetron (ZOFRAN-ODT) 4 MG disintegrating tablet TAKE ONE TABLET BY MOUTH EVERY 8 HOURS AS NEEDED FOR NAUSEA AND VOMITING 30 tablet 1   potassium chloride SA (KLOR-CON M) 20 MEQ tablet Take 20 mEq by mouth daily.     pregabalin (LYRICA) 200 MG capsule Take 200 mg by mouth 3 (three) times daily.     sucralfate (CARAFATE) 1 GM/10ML suspension TAKE TWO TEASPOONSFUL (10ML) BY MOUTH FOUR TIMES DAILY WITH MEALS AND AT BEDTIME 420 mL 1   AIMOVIG 140 MG/ML SOAJ Inject 140 mg into the skin every 30 (thirty) days.     divalproex (DEPAKOTE) 250 MG DR tablet Take 500 mg by mouth 2 (two) times daily.     No facility-administered medications prior to visit.    PAST MEDICAL HISTORY: Past Medical History:  Diagnosis Date   Crohn disease (  [place])    Depression    Heart murmur    Post traumatic stress disorder (PTSD)     PAST SURGICAL HISTORY: Past Surgical History:  Procedure Laterality Date   ABDOMINAL SURGERY     stents   BIOPSY  06/21/2021   Procedure: BIOPSY;  Surgeon: Harvel Quale, MD;  [facility];  Service: Gastroenterology;;   CHOLECYSTECTOMY     COLONOSCOPY WITH PROPOFOL N/A 06/21/2021   Procedure: COLONOSCOPY WITH PROPOFOL;  Surgeon: Harvel Quale, MD;  [facility];  Service: Gastroenterology;  Laterality: N/A;    ESOPHAGOGASTRODUODENOSCOPY (EGD) WITH PROPOFOL N/A 06/21/2021   Procedure: ESOPHAGOGASTRODUODENOSCOPY (EGD) WITH PROPOFOL;  Surgeon: Harvel Quale, MD;  [facility];  Service: Gastroenterology;  Laterality: N/A;  945   KNEE SURGERY     POLYPECTOMY  06/21/2021   Procedure: POLYPECTOMY;  Surgeon: Montez Morita, Quillian Quince, MD;  [facility];  Service: Gastroenterology;;   thumb surgery      FAMILY HISTORY: Family History  Problem Relation Age of Onset   Hypertension Mother    Heart failure Mother    COPD Mother     SOCIAL HISTORY: Social History   Socioeconomic History   Marital status: Married    Spouse name: Not on file   Number of children: Not on file   Years of education: Not on file   Highest education level: Not on file  Occupational History   Not on file  Tobacco Use   Smoking status: Every Day    Packs/day: 2.00    Types: Cigarettes   Smokeless tobacco: Never  Vaping Use   Vaping Use: Never used  Substance and Sexual Activity   Alcohol use: Not Currently    Alcohol/week: 84.0 standard drinks of alcohol    Types: 84 Cans of beer per week    Comment: nothing since January 2023   Drug use: Yes    Frequency: 2.0 times per week    Types: Marijuana    Comment: last used 06/19/21   Sexual activity: Yes  Other Topics Concern   Not on file  Social History Narrative   Not on file   Social Determinants of Health   Financial Resource Strain: Not on file  Food Insecurity: Not on file  Transportation Needs: Not on file  Physical Activity: Not on file  Stress: Not on file  Social Connections: Not on file  Intimate Partner Violence: Not on file    PHYSICAL EXAM  GENERAL EXAM/CONSTITUTIONAL: Vitals:  Vitals:   11/26/21 1450  BP: 110/73  Pulse: 76  Weight: 154 lb (69.9 kg)  Height: '5\' 6"'$  (1.676 m)   Body mass index is 24.86 kg/m. Wt Readings from Last 3 Encounters:  11/26/21 154 lb (69.9 kg)  08/20/21 151 lb 1.6 oz (68.5  kg)  06/21/21 135 lb (61.2 kg)   Patient is in no distress; well developed, poorly groomed and disheveled, neck is supple  EYES: Pupils round and reactive to light, Visual fields full to confrontation, Extraocular movements intacts,  No results found.  MUSCULOSKELETAL: Gait, strength, tone, movements noted in Neurologic exam below  NEUROLOGIC: MENTAL STATUS:      No data to display         awake, alert, oriented to person, place and time recent and remote memory intact normal attention and concentration language fluent, comprehension intact, naming intact fund of knowledge appropriate  CRANIAL NERVE:  2nd, 3rd, 4th, 6th - pupils equal and reactive to  light, visual fields full to confrontation, extraocular muscles intact, no nystagmus 5th - facial sensation symmetric 7th - facial strength symmetric 8th - hearing intact 9th - palate elevates symmetrically, uvula midline 11th - shoulder shrug symmetric 12th - tongue protrusion midline  MOTOR:  normal bulk and tone, full strength in the BUE, BLE  SENSORY:  normal and symmetric to light touch  COORDINATION:  finger-nose-finger, fine finger movements normal  GAIT/STATION:  Antalgic gait. History of left knee pain and previous surgeries     DIAGNOSTIC DATA (LABS, IMAGING, TESTING) - I reviewed patient records, labs, notes, testing and imaging myself where available.  Lab Results  Component Value Date   WBC 11.8 (H) 11/23/2010   HGB 14.1 11/23/2010   HCT 41.3 11/23/2010   MCV 90.8 11/23/2010   PLT 263 11/23/2010      Component Value Date/Time   NA 140 11/23/2010 1913   K 3.7 11/23/2010 1913   CL 105 11/23/2010 1913   CO2 22 11/23/2010 1913   GLUCOSE 89 11/23/2010 1913   BUN 7 11/23/2010 1913   CREATININE 0.82 11/23/2010 1913   CALCIUM 8.9 11/23/2010 1913   PROT 6.6 11/23/2010 1913   ALBUMIN 3.2 (L) 11/23/2010 1913   AST 19 11/23/2010 1913   ALT 12 11/23/2010 1913   ALKPHOS 95 11/23/2010 1913    BILITOT 0.2 (L) 11/23/2010 1913   GFRNONAA >60 11/23/2010 1913   GFRAA >60 11/23/2010 1913   No results found for: "CHOL", "HDL", "LDLCALC", "LDLDIRECT", "TRIG" No results found for: "HGBA1C" No results found for: "VITAMINB12" No results found for: "TSH"  MRI Brain 09/18/2021 No evidence of recent infarction, hemorrhage, or mass. No abnormal enhancement.     ASSESSMENT AND PLAN  51 y.o. year old male  with seizure disorder and migraines here to establish care. For his seizures, he is on Depakote 500 mg BID but seizures still not controlled. I will increase it to 500 three time daily. I will also check a depakote level with LFTs and CBC. His migraines are stable per patient, refill for Aimovig given. I will see him in 6 months for follow up.    1. Seizure disorder ([place])   2. Chronic migraine without aura with status migrainosus, not intractable   3. Therapeutic drug monitoring     Patient Instructions  Increase Depakote 500 mg three times daily  Will check Depakote level with LFTs and CBC  Routine EEG  Continue your other medication. (Refill for Aimovig given)  Follow up in 6 months    Per [facility] statutes, patients with seizures are not allowed to drive until they have been seizure-free for six months.  Other recommendations include using caution when using heavy equipment or power tools. Avoid working on ladders or at heights. Take showers instead of baths.  Do not swim alone.  Ensure the water temperature is not too high on the home water heater. Do not go swimming alone. Do not lock yourself in a room alone (i.e. bathroom). When caring for infants or small children, sit down when holding, feeding, or changing them to minimize risk of injury to the child in the event you have a seizure. Maintain good sleep hygiene. Avoid alcohol.  Also recommend adequate sleep, hydration, good diet and minimize stress.   During the Seizure  - First, ensure adequate ventilation and  place patients on the floor on their left side  Loosen clothing around the neck and ensure the airway is patent. If the patient is  clenching the teeth, do not force the mouth open with any object as this can cause severe damage - Remove all items from the surrounding that can be hazardous. The patient may be oblivious to what's happening and may not even know what he or she is doing. If the patient is confused and wandering, either gently guide him/her away and block access to outside areas - Reassure the individual and be comforting - Call 911. In most cases, the seizure ends before EMS arrives. However, there are cases when seizures may last over 3 to 5 minutes. Or the individual may have developed breathing difficulties or severe injuries. If a pregnant patient or a person with diabetes develops a seizure, it is prudent to call an ambulance. - Finally, if the patient does not regain full consciousness, then call EMS. Most patients will remain confused for about 45 to 90 minutes after a seizure, so you must use judgment in calling for help. - Avoid restraints but make sure the patient is in a bed with padded side rails - Place the individual in a lateral position with the neck slightly flexed; this will help the saliva drain from the mouth and prevent the tongue from falling backward - Remove all nearby furniture and other hazards from the area - Provide verbal assurance as the individual is regaining consciousness - Provide the patient with privacy if possible - Call for help and start treatment as ordered by the caregiver   After the Seizure (Postictal Stage)  After a seizure, most patients experience confusion, fatigue, muscle pain and/or a headache. Thus, one should permit the individual to sleep. For the next few days, reassurance is essential. Being calm and helping reorient the person is also of importance.  Most seizures are painless and end spontaneously. Seizures are not harmful to  others but can lead to complications such as stress on the lungs, brain and the heart. Individuals with prior lung problems may develop labored breathing and respiratory distress.     Orders Placed This Encounter  Procedures   Valproic Acid Level   Hepatic Function Panel   CBC (no diff)   EEG adult    Meds ordered this encounter  Medications   divalproex (DEPAKOTE) 250 MG DR tablet    Sig: Take 2 tablets (500 mg total) by mouth 3 (three) times daily.    Dispense:  180 tablet    Refill:  6   AIMOVIG 140 MG/ML SOAJ    Sig: Inject 140 mg into the skin every 30 (thirty) days.    Dispense:  1.12 mL    Refill:  11    Return in about 6 months (around 05/29/2022).    Alric Ran, MD 11/26/2021, 9:53 PM  [facility] [address], [place],  [address] [phone] )-

## 2021-11-26 NOTE — Patient Instructions (Addendum)
Increase Depakote 500 mg three times daily  Will check Depakote level with LFTs and CBC  Routine EEG  Continue your other medication. (Refill for Aimovig given)  Follow up in 6 months

## 2021-11-27 ENCOUNTER — Telehealth: Payer: Self-pay | Admitting: *Deleted

## 2021-11-27 LAB — HEPATIC FUNCTION PANEL
ALT: 22 IU/L (ref 0–44)
AST: 18 IU/L (ref 0–40)
Albumin: 4.2 g/dL (ref 4.1–5.1)
Alkaline Phosphatase: 166 IU/L — ABNORMAL HIGH (ref 44–121)
Bilirubin Total: <0.2 mg/dL (ref 0.0–1.2)
Bilirubin, Direct: <0.1 mg/dL (ref 0.00–0.40)
Total Protein: 6.5 g/dL (ref 6.0–8.5)

## 2021-11-27 LAB — CBC
Hematocrit: 41.2 % (ref 37.5–51.0)
Hemoglobin: 13.8 g/dL (ref 13.0–17.7)
MCH: 29.8 pg (ref 26.6–33.0)
MCHC: 33.5 g/dL (ref 31.5–35.7)
MCV: 89 fL (ref 79–97)
Platelets: 257 10*3/uL (ref 150–450)
RBC: 4.63 x10E6/uL (ref 4.14–5.80)
RDW: 14.7 % (ref 11.6–15.4)
WBC: 8.9 10*3/uL (ref 3.4–10.8)

## 2021-11-27 LAB — VALPROIC ACID LEVEL: Valproic Acid Lvl: 71 ug/mL (ref 50–100)

## 2021-11-27 NOTE — Progress Notes (Signed)
Please call and advise the patient that the recent labs we checked were within normal limits except for the Alkaline phosphatase. We see this elevation sometimes in patient with kidney disease or with bone disease. Please follow up with your PCP to have the level recheck.   Please remind patient to keep any upcoming appointments or tests and to call us with any interim questions, concerns, problems or updates. Thanks,   Alric Ran, MD

## 2021-11-27 NOTE — Telephone Encounter (Signed)
-----   Message from Alric Ran, MD sent at 11/27/2021  4:01 PM EDT ----- Please call and advise the patient that the recent labs we checked were within normal limits except for the Alkaline phosphatase. We see this elevation sometimes in patient with kidney disease or with bone disease. Please follow up with your PCP to have the level recheck.   Please remind patient to keep any upcoming appointments or tests and to call us with any interim questions, concerns, problems or updates. Thanks,   Alric Ran, MD

## 2021-11-27 NOTE — Telephone Encounter (Signed)
I spoke to this wife on DPR and provided her with the results. Reports patient has a pending appt with his PCP in a few weeks. She will discuss repeat labs with him.

## 2021-12-05 ENCOUNTER — Encounter (HOSPITAL_COMMUNITY)
Admission: RE | Admit: 2021-12-05 | Discharge: 2021-12-05 | Disposition: A | Discharge status: Home / Self Care | Payer: Medicare HMO | Source: Ambulatory Visit | Attending: Gastroenterology | Admitting: Gastroenterology

## 2021-12-05 ENCOUNTER — Encounter (HOSPITAL_COMMUNITY): Payer: Self-pay

## 2021-12-10 ENCOUNTER — Encounter (HOSPITAL_COMMUNITY)
Admission: RE | Disposition: A | Discharge status: Home / Self Care | Payer: Self-pay | Source: Ambulatory Visit | Attending: Gastroenterology

## 2021-12-10 ENCOUNTER — Encounter (HOSPITAL_COMMUNITY): Payer: Self-pay | Admitting: Gastroenterology

## 2021-12-10 ENCOUNTER — Other Ambulatory Visit: Payer: Self-pay

## 2021-12-10 ENCOUNTER — Ambulatory Visit (HOSPITAL_COMMUNITY)
Admission: RE | Admit: 2021-12-10 | Discharge: 2021-12-10 | Disposition: A | Discharge status: Home / Self Care | Payer: Medicare HMO | Source: Ambulatory Visit | Attending: Gastroenterology | Admitting: Gastroenterology

## 2021-12-10 ENCOUNTER — Ambulatory Visit (HOSPITAL_COMMUNITY): Payer: Medicare HMO | Admitting: Certified Registered Nurse Anesthetist

## 2021-12-10 ENCOUNTER — Ambulatory Visit (HOSPITAL_BASED_OUTPATIENT_CLINIC_OR_DEPARTMENT_OTHER): Payer: Medicare HMO | Admitting: Certified Registered Nurse Anesthetist

## 2021-12-10 DIAGNOSIS — K2289 Other specified disease of esophagus: Secondary | ICD-10-CM | POA: Insufficient documentation

## 2021-12-10 DIAGNOSIS — K449 Diaphragmatic hernia without obstruction or gangrene: Secondary | ICD-10-CM | POA: Insufficient documentation

## 2021-12-10 DIAGNOSIS — R1013 Epigastric pain: Secondary | ICD-10-CM | POA: Insufficient documentation

## 2021-12-10 DIAGNOSIS — R1310 Dysphagia, unspecified: Secondary | ICD-10-CM

## 2021-12-10 DIAGNOSIS — F418 Other specified anxiety disorders: Secondary | ICD-10-CM | POA: Diagnosis not present

## 2021-12-10 DIAGNOSIS — R569 Unspecified convulsions: Secondary | ICD-10-CM | POA: Diagnosis not present

## 2021-12-10 DIAGNOSIS — K3189 Other diseases of stomach and duodenum: Secondary | ICD-10-CM | POA: Insufficient documentation

## 2021-12-10 DIAGNOSIS — K2100 Gastro-esophageal reflux disease with esophagitis, without bleeding: Secondary | ICD-10-CM

## 2021-12-10 DIAGNOSIS — F17200 Nicotine dependence, unspecified, uncomplicated: Secondary | ICD-10-CM | POA: Insufficient documentation

## 2021-12-10 DIAGNOSIS — K2900 Acute gastritis without bleeding: Secondary | ICD-10-CM | POA: Diagnosis not present

## 2021-12-10 DIAGNOSIS — K2970 Gastritis, unspecified, without bleeding: Secondary | ICD-10-CM

## 2021-12-10 HISTORY — DX: Other specified health status: Z78.9

## 2021-12-10 HISTORY — DX: Unspecified convulsions: R56.9

## 2021-12-10 HISTORY — PX: BIOPSY: SHX5522

## 2021-12-10 HISTORY — PX: ESOPHAGOGASTRODUODENOSCOPY (EGD) WITH PROPOFOL: SHX5813

## 2021-12-10 SURGERY — ESOPHAGOGASTRODUODENOSCOPY (EGD) WITH PROPOFOL
Anesthesia: General

## 2021-12-10 MED ORDER — PROPOFOL 500 MG/50ML IV EMUL
INTRAVENOUS | Status: DC | PRN
  Administered 2021-12-10: 200 ug/kg/min via INTRAVENOUS

## 2021-12-10 MED ORDER — PROPOFOL 10 MG/ML IV BOLUS
INTRAVENOUS | Status: DC | PRN
  Administered 2021-12-10: 40 mg via INTRAVENOUS
  Administered 2021-12-10: 30 mg via INTRAVENOUS

## 2021-12-10 MED ORDER — LACTATED RINGERS IV SOLN: INTRAVENOUS | Status: DC

## 2021-12-10 MED ORDER — LIDOCAINE 2% (20 MG/ML) 5 ML SYRINGE
INTRAMUSCULAR | Status: DC | PRN
  Administered 2021-12-10: 50 mg via INTRAVENOUS

## 2021-12-10 NOTE — Op Note (Signed)
[facility] Patient Name: Joshua Hale Procedure Date: 12/10/2021 1:05 PM MRN: [id number] Date of Birth: [birth year] Attending MD: Maylon Peppers ,  CSN: [id number] Age: 51 Admit Type: Outpatient Procedure:                Upper GI endoscopy Indications:              Epigastric abdominal pain, Follow-up of                            gastro-esophageal reflux disease Providers:                Maylon Peppers, Rosina Lowenstein, RN, Crystal Page Referring MD:              Medicines:                Monitored Anesthesia Care Complications:            No immediate complications. Estimated Blood Loss:     Estimated blood loss: none. Procedure:                Pre-Anesthesia Assessment:                           - Prior to the procedure, a History and Physical                            was performed, and patient medications, allergies                            and sensitivities were reviewed. The patient's                            tolerance of previous anesthesia was reviewed.                           - The risks and benefits of the procedure and the                            sedation options and risks were discussed with the                            patient. All questions were answered and informed                            consent was obtained.                           - ASA Grade Assessment: II - A patient with mild                            systemic disease.                           After obtaining informed consent, the endoscope was                            passed under direct vision. Throughout the  procedure, the patient's blood pressure, pulse, and                            oxygen saturations were monitored continuously. The                            GIF-H190 ([id number]) scope was introduced through the                            mouth, and advanced to the second part of duodenum.                            The upper GI endoscopy was accomplished  without                            difficulty. The patient tolerated the procedure                            well. Scope In: 1:13:03 PM Scope Out: 1:19:30 PM Total Procedure Duration: 0 hours 6 minutes 27 seconds  Findings:      One tongue of salmon-colored mucosa was present from 34 to 35 cm.       Squamous islands were present. The maximum longitudinal extent of these       esophageal mucosal changes was 1 cm in length. Biopsies were taken with       a cold forceps for histology.      A 5 cm hiatal hernia was found. The hiatal narrowing was 40 cm from the       incisors. The Z-line was 35 cm from the incisors.      The gastroesophageal flap valve was visualized endoscopically and       classified as Hill Grade IV (no fold, wide open lumen, hiatal hernia       present).      Diffuse mildly erythematous mucosa without bleeding was found in the       gastric antrum. Biopsies were taken with a cold forceps for histology       and H. pylori.      The examined duodenum was normal. Impression:               - Salmon-colored mucosa suspicious for                            short-segment Barrett's esophagus. Biopsied.                           - 5 cm hiatal hernia.                           - Erythematous mucosa in the antrum. Biopsied.                           - Normal examined duodenum. Moderate Sedation:      Per Anesthesia Care Recommendation:           - Discharge patient to home (ambulatory).                           -  Resume previous diet.                           - Continue present medications.                           - Check H.pylori serology Procedure Code(s):        --- Professional ---                           [phone], Esophagogastroduodenoscopy, flexible,                            transoral; with biopsy, single or multiple Diagnosis Code(s):        --- Professional ---                           K22.8, Other specified diseases of esophagus                            K44.9, Diaphragmatic hernia without obstruction or                            gangrene                           K31.89, Other diseases of stomach and duodenum                           R10.13, Epigastric pain                           K21.9, Gastro-esophageal reflux disease without                            esophagitis CPT copyright 2019 American Medical Association. All rights reserved. The codes documented in this report are preliminary and upon coder review may  be revised to meet current compliance requirements. Maylon Peppers, MD Maylon Peppers,  12/10/2021 1:27:38 PM This report has been signed electronically. Number of Addenda: 0

## 2021-12-10 NOTE — Discharge Instructions (Signed)
Resume your previous diet Continue your present medications The office will call you with biopsy results

## 2021-12-10 NOTE — Transfer of Care (Signed)
Immediate Anesthesia Transfer of Care Note  Patient: Joshua Hale.  Procedure(s) Performed: ESOPHAGOGASTRODUODENOSCOPY (EGD) WITH PROPOFOL BIOPSY  Patient Location: Endoscopy Unit  Anesthesia Type:MAC  Level of Consciousness: drowsy and patient cooperative  Airway & Oxygen Therapy: Patient Spontanous Breathing  Post-op Assessment: Report given to RN and Post -op Vital signs reviewed and stable  Post vital signs: Reviewed and stable  Last Vitals:  Vitals Value Taken Time  BP 95/64   Temp    Pulse 73   Resp 15   SpO2 97%     Last Pain:  Vitals:   12/10/21 1310  TempSrc:   PainSc: 9       Patients Stated Pain Goal: 3 (34/14/43 6016)  Complications: No notable events documented.

## 2021-12-10 NOTE — H&P (Addendum)
Joshua Hale. is an 51 y.o. male.   Chief Complaint: abdominal pain, history of esophagitis. HPI: 51 y/o M with PMH depression, GERD complicated by esophagitis, seizures, PTSD, coming for evaluation of abdominal pain history of esophagitis.  Patient reports that his heartburn has much more improved with the use of PPI and Carafate but he is still having some epigastric abdominal pain, denies any nausea, vomiting, fever, chills, melena or hematochezia.  Past Medical History:  Diagnosis Date   Crohn disease ([place])    Depression    Heart murmur    Medical history non-contributory    Post traumatic stress disorder (PTSD)    Seizures ([place])     Past Surgical History:  Procedure Laterality Date   ABDOMINAL SURGERY     stents   BIOPSY  06/21/2021   Procedure: BIOPSY;  Surgeon: Harvel Quale, MD;  [facility];  Service: Gastroenterology;;   CHOLECYSTECTOMY     COLONOSCOPY WITH PROPOFOL N/A 06/21/2021   Procedure: COLONOSCOPY WITH PROPOFOL;  Surgeon: Harvel Quale, MD;  [facility];  Service: Gastroenterology;  Laterality: N/A;   ESOPHAGOGASTRODUODENOSCOPY (EGD) WITH PROPOFOL N/A 06/21/2021   Procedure: ESOPHAGOGASTRODUODENOSCOPY (EGD) WITH PROPOFOL;  Surgeon: Harvel Quale, MD;  [facility];  Service: Gastroenterology;  Laterality: N/A;  945   KNEE SURGERY     POLYPECTOMY  06/21/2021   Procedure: POLYPECTOMY;  Surgeon: Harvel Quale, MD;  [facility];  Service: Gastroenterology;;   thumb surgery      Family History  Problem Relation Age of Onset   Hypertension Mother    Heart failure Mother    COPD Mother    Social History:  reports that he has been smoking cigarettes. He has been smoking an average of 2 packs per day. He has never used smokeless tobacco. He reports that he does not currently use alcohol after a past usage of about 84.0 standard drinks of alcohol per week. He reports current  drug use. Frequency: 2.00 times per week. Drug: Marijuana.  Allergies:  Allergies  Allergen Reactions   Baclofen Swelling   Fentanyl Hives and Swelling   Onion Swelling   Oxycodone Other (See Comments)    Puts him in another world   Ultram [Tramadol] Nausea Only    Medications Prior to Admission  Medication Sig Dispense Refill   AIMOVIG 140 MG/ML SOAJ Inject 140 mg into the skin every 30 (thirty) days. 1.12 mL 11   busPIRone (BUSPAR) 15 MG tablet Take 15 mg by mouth 2 (two) times daily.     diazepam (VALIUM) 10 MG tablet Take 10 mg by mouth 2 (two) times daily.     divalproex (DEPAKOTE) 500 MG DR tablet Take 500 mg by mouth 3 (three) times daily.     doxepin (SINEQUAN) 10 MG capsule Take 10 mg by mouth at bedtime.     esomeprazole (NEXIUM) 40 MG capsule TAKE ONE CAPSULE BY MOUTH TWICE DAILY BEFORE A MEAL (Patient taking differently: Take 40 mg by mouth daily.) 60 capsule 1   Magnesium 400 MG TABS Take 400 mg by mouth daily.     NAYZILAM 5 MG/0.1ML SOLN Place 1 spray into both nostrils daily as needed (Seizures).     ondansetron (ZOFRAN-ODT) 4 MG disintegrating tablet TAKE ONE TABLET BY MOUTH EVERY 8 HOURS AS NEEDED FOR NAUSEA AND VOMITING 30 tablet 1   potassium chloride SA (KLOR-CON M) 20 MEQ tablet Take 20 mEq by mouth daily.  pregabalin (LYRICA) 200 MG capsule Take 200 mg by mouth 3 (three) times daily.     sucralfate (CARAFATE) 1 GM/10ML suspension TAKE TWO TEASPOONSFUL (10ML) BY MOUTH FOUR TIMES DAILY WITH MEALS AND AT BEDTIME 420 mL 1    No results found for this or any previous visit (from the past 48 hour(s)). No results found.  Review of Systems  Constitutional: Negative.   HENT: Negative.    Eyes: Negative.   Respiratory: Negative.    Cardiovascular: Negative.   Gastrointestinal:  Positive for abdominal pain.  Endocrine: Negative.   Genitourinary: Negative.   Musculoskeletal: Negative.   Skin: Negative.   Allergic/Immunologic: Negative.   Neurological:  Negative.   Hematological: Negative.   Psychiatric/Behavioral: Negative.      Blood pressure 126/85, pulse 79, temperature 97.8 F (36.6 C), temperature source Oral, resp. rate 14, SpO2 98 %. Physical Exam  GENERAL: The patient is AO x3, in no acute distress. HEENT: Head is normocephalic and atraumatic. EOMI are intact. Mouth is well hydrated and without lesions. NECK: Supple. No masses LUNGS: Clear to auscultation. No presence of rhonchi/wheezing/rales. Adequate chest expansion HEART: RRR, normal s1 and s2. ABDOMEN: Soft, nontender, no guarding, no peritoneal signs, and nondistended. BS +. No masses. EXTREMITIES: Without any cyanosis, clubbing, rash, lesions or edema. NEUROLOGIC: AOx3, no focal motor deficit. SKIN: no jaundice, no rashes  Assessment/Plan 51 y/o M with PMH depression, GERD complicated by esophagitis, seizures, PTSD, coming for evaluation of abdominal pain history of esophagitis.  We will proceed with EGD.  Harvel Quale, MD 12/10/2021, 1:08 PM

## 2021-12-10 NOTE — Anesthesia Preprocedure Evaluation (Signed)
Anesthesia Evaluation  Patient identified by MRN, date of birth, ID band Patient awake    Reviewed: Allergy & Precautions, H&P , NPO status , Patient's Chart, lab work & pertinent test results, reviewed documented beta blocker date and time   Airway Mallampati: II  TM Distance: >3 FB Neck ROM: full    Dental no notable dental hx.    Pulmonary neg pulmonary ROS, Current Smoker,    Pulmonary exam normal breath sounds clear to auscultation       Cardiovascular Exercise Tolerance: Good negative cardio ROS   Rhythm:regular Rate:Normal     Neuro/Psych  Headaches, Seizures -, Well Controlled,  PSYCHIATRIC DISORDERS Anxiety Depression    GI/Hepatic negative GI ROS, Neg liver ROS,   Endo/Other  negative endocrine ROS  Renal/GU negative Renal ROS  negative genitourinary   Musculoskeletal   Abdominal   Peds  Hematology negative hematology ROS (+)   Anesthesia Other Findings   Reproductive/Obstetrics negative OB ROS                             Anesthesia Physical Anesthesia Plan  ASA: 2  Anesthesia Plan: General   Post-op Pain Management:    Induction:   PONV Risk Score and Plan: Propofol infusion  Airway Management Planned:   Additional Equipment:   Intra-op Plan:   Post-operative Plan:   Informed Consent: I have reviewed the patients History and Physical, chart, labs and discussed the procedure including the risks, benefits and alternatives for the proposed anesthesia with the patient or authorized representative who has indicated his/her understanding and acceptance.     Dental Advisory Given  Plan Discussed with: CRNA  Anesthesia Plan Comments:         Anesthesia Quick Evaluation

## 2021-12-12 LAB — H. PYLORI ANTIBODY, IGG: H Pylori IgG: 0.45 Index Value (ref 0.00–0.79)

## 2021-12-12 LAB — SURGICAL PATHOLOGY

## 2021-12-12 NOTE — Anesthesia Postprocedure Evaluation (Signed)
Anesthesia Post Note  Patient: Joshua Hale.  Procedure(s) Performed: ESOPHAGOGASTRODUODENOSCOPY (EGD) WITH PROPOFOL BIOPSY  Patient location during evaluation: Phase II Anesthesia Type: General Level of consciousness: awake Pain management: pain level controlled Vital Signs Assessment: post-procedure vital signs reviewed and stable Respiratory status: spontaneous breathing and respiratory function stable Cardiovascular status: blood pressure returned to baseline and stable Postop Assessment: no headache and no apparent nausea or vomiting Anesthetic complications: no Comments: Late entry   No notable events documented.   Last Vitals:  Vitals:   12/10/21 1325 12/10/21 1349  BP:  110/60  Pulse: 79   Resp: 16   Temp: (!) 36.4 C   SpO2: 94%     Last Pain:  Vitals:   12/10/21 1349  TempSrc:   PainSc: 0-No pain                 Louann Sjogren

## 2021-12-13 ENCOUNTER — Encounter (HOSPITAL_COMMUNITY): Payer: Self-pay | Admitting: Gastroenterology

## 2021-12-17 ENCOUNTER — Ambulatory Visit (INDEPENDENT_AMBULATORY_CARE_PROVIDER_SITE_OTHER): Payer: Medicare HMO | Admitting: Neurology

## 2021-12-17 DIAGNOSIS — G40909 Epilepsy, unspecified, not intractable, without status epilepticus: Secondary | ICD-10-CM

## 2021-12-17 NOTE — Procedures (Signed)
    History:  51 year old man with seizure  EEG classification: Awake and drowsy  Description of the recording: The background rhythms of this recording consists of a fairly well modulated medium amplitude alpha rhythm of 9-10 Hz that is reactive to eye opening and closure. As the record progresses, the patient appears to remain in the waking state throughout the recording. Photic stimulation was performed, did not show any abnormalities. Hyperventilation was also performed, did not show any abnormalities. Toward the end of the recording, the patient enters the drowsy state with slight symmetric slowing seen. The patient never enters stage II sleep. No abnormal epileptiform discharges seen during this recording. There was no focal slowing. EKG monitor shows no evidence of cardiac rhythm abnormalities with a heart rate of 72.  Abnormality: None   Impression: This is a normal EEG recording in the waking and drowsy state. No evidence of interictal epileptiform discharges seen. A normal EEG does not exclude a diagnosis of epilepsy.    Alric Ran, MD [facility]

## 2021-12-20 ENCOUNTER — Other Ambulatory Visit (INDEPENDENT_AMBULATORY_CARE_PROVIDER_SITE_OTHER): Payer: Self-pay | Admitting: Gastroenterology

## 2021-12-20 DIAGNOSIS — R112 Nausea with vomiting, unspecified: Secondary | ICD-10-CM

## 2021-12-23 NOTE — Telephone Encounter (Signed)
Seen 08/20/21 and next appt 12/31/21

## 2021-12-31 ENCOUNTER — Encounter (INDEPENDENT_AMBULATORY_CARE_PROVIDER_SITE_OTHER): Payer: Self-pay | Admitting: Gastroenterology

## 2021-12-31 ENCOUNTER — Ambulatory Visit (INDEPENDENT_AMBULATORY_CARE_PROVIDER_SITE_OTHER): Payer: Medicare HMO | Admitting: Gastroenterology

## 2022-01-08 DIAGNOSIS — I10 Essential (primary) hypertension: Secondary | ICD-10-CM | POA: Diagnosis not present

## 2022-01-08 DIAGNOSIS — Z6824 Body mass index (BMI) 24.0-24.9, adult: Secondary | ICD-10-CM | POA: Diagnosis not present

## 2022-01-08 DIAGNOSIS — G40919 Epilepsy, unspecified, intractable, without status epilepticus: Secondary | ICD-10-CM | POA: Diagnosis not present

## 2022-01-08 DIAGNOSIS — M25561 Pain in right knee: Secondary | ICD-10-CM | POA: Diagnosis not present

## 2022-01-08 DIAGNOSIS — F411 Generalized anxiety disorder: Secondary | ICD-10-CM | POA: Diagnosis not present

## 2022-01-28 ENCOUNTER — Other Ambulatory Visit (INDEPENDENT_AMBULATORY_CARE_PROVIDER_SITE_OTHER): Payer: Self-pay | Admitting: Gastroenterology

## 2022-02-06 ENCOUNTER — Ambulatory Visit: Payer: Medicare HMO | Admitting: Orthopaedic Surgery

## 2022-02-18 ENCOUNTER — Other Ambulatory Visit (INDEPENDENT_AMBULATORY_CARE_PROVIDER_SITE_OTHER): Payer: Self-pay | Admitting: Gastroenterology

## 2022-02-18 DIAGNOSIS — R112 Nausea with vomiting, unspecified: Secondary | ICD-10-CM

## 2022-03-04 ENCOUNTER — Other Ambulatory Visit (INDEPENDENT_AMBULATORY_CARE_PROVIDER_SITE_OTHER): Payer: Self-pay | Admitting: Gastroenterology

## 2022-03-17 ENCOUNTER — Ambulatory Visit (INDEPENDENT_AMBULATORY_CARE_PROVIDER_SITE_OTHER): Payer: Medicare HMO | Admitting: Gastroenterology

## 2022-03-24 ENCOUNTER — Other Ambulatory Visit (INDEPENDENT_AMBULATORY_CARE_PROVIDER_SITE_OTHER): Payer: Self-pay | Admitting: Gastroenterology

## 2022-03-24 DIAGNOSIS — R112 Nausea with vomiting, unspecified: Secondary | ICD-10-CM

## 2022-05-09 ENCOUNTER — Other Ambulatory Visit (INDEPENDENT_AMBULATORY_CARE_PROVIDER_SITE_OTHER): Payer: Self-pay | Admitting: Gastroenterology

## 2022-05-15 ENCOUNTER — Other Ambulatory Visit (INDEPENDENT_AMBULATORY_CARE_PROVIDER_SITE_OTHER): Payer: Self-pay | Admitting: Gastroenterology

## 2022-05-15 DIAGNOSIS — R112 Nausea with vomiting, unspecified: Secondary | ICD-10-CM

## 2022-05-15 NOTE — Telephone Encounter (Signed)
Last seen 08/20/21 and note states continue zofran as needed. Has appt on 05/26/22

## 2022-05-21 DIAGNOSIS — F411 Generalized anxiety disorder: Secondary | ICD-10-CM | POA: Diagnosis not present

## 2022-05-21 DIAGNOSIS — I10 Essential (primary) hypertension: Secondary | ICD-10-CM | POA: Diagnosis not present

## 2022-05-21 DIAGNOSIS — Z6823 Body mass index (BMI) 23.0-23.9, adult: Secondary | ICD-10-CM | POA: Diagnosis not present

## 2022-05-21 DIAGNOSIS — G40919 Epilepsy, unspecified, intractable, without status epilepticus: Secondary | ICD-10-CM | POA: Diagnosis not present

## 2022-05-21 DIAGNOSIS — M25561 Pain in right knee: Secondary | ICD-10-CM | POA: Diagnosis not present

## 2022-05-26 ENCOUNTER — Ambulatory Visit (INDEPENDENT_AMBULATORY_CARE_PROVIDER_SITE_OTHER): Payer: Medicare HMO | Admitting: Gastroenterology

## 2022-05-26 ENCOUNTER — Encounter (INDEPENDENT_AMBULATORY_CARE_PROVIDER_SITE_OTHER): Payer: Self-pay | Admitting: Gastroenterology

## 2022-05-26 VITALS — BP 118/77 | HR 104 | Temp 97.5°F | Ht 66.0 in | Wt 160.7 lb

## 2022-05-26 DIAGNOSIS — K2100 Gastro-esophageal reflux disease with esophagitis, without bleeding: Secondary | ICD-10-CM | POA: Diagnosis not present

## 2022-05-26 DIAGNOSIS — R109 Unspecified abdominal pain: Secondary | ICD-10-CM | POA: Diagnosis not present

## 2022-05-26 DIAGNOSIS — K5090 Crohn's disease, unspecified, without complications: Secondary | ICD-10-CM | POA: Diagnosis not present

## 2022-05-26 DIAGNOSIS — R110 Nausea: Secondary | ICD-10-CM | POA: Diagnosis not present

## 2022-05-26 DIAGNOSIS — G8929 Other chronic pain: Secondary | ICD-10-CM | POA: Diagnosis not present

## 2022-05-26 MED ORDER — DICYCLOMINE HCL 10 MG PO CAPS: 10.0000 mg | ORAL_CAPSULE | Freq: Two times a day (BID) | ORAL | 5 refills | Status: DC | PRN

## 2022-05-26 NOTE — Progress Notes (Unsigned)
Joshua Hale, M.D. Gastroenterology & Hepatology [facility] [address] [place], [place] [address]  Primary Care Physician: Neale Burly, MD [place] [phone]  I will communicate my assessment and recommendations to the referring MD via EMR.  Problems: Chronic abdominal pain Chronic nausea GERD History of grade D esophagitis  History of Present Illness: Joshua Corbeil. is a 52 y.o. male with past medical history of PTSD, GERD complicated by esophagitis, gastritis, depression, seizures, who presents for follow up of abdominal pain and nausea.  The patient was last seen on 08/20/2021. At that time, the patient was advised to repeat EGD, he was switched to Nexium 40 mg twice daily and continue Carafate 1 g every 6.  Zofran was refilled.  H. pylori serology was negative.  Patient reports that he is still feeling pain in his abdomen. He states having pain every day in his abdomen diffusely. He reports that the pain wakes him up during the night. Reports the heartburn has improved -currently taking nexium twice a day compliantly. Also takes sucralfate, which has controlled his breakthrough episodes of heartburn.  Reports that nausea has significantly improved as he has been taking Zofran, maybe 2-3 times per month may need to take this medication.  The patient denies having any vomiting, fever, chills, hematochezia, melena, hematemesis, abdominal distention,  diarrhea, jaundice, pruritus or weight loss.  Patient had a CT enterography on 07/11/2021 did not show any active inflammation, there was presence of moderate hiatal hernia with distal esophageal thickening suspicious for esophagitis.  Last EGD: 12/10/2021 Salmon-colored mucosa was found between 34 to 35 cm (consistent with reflux changes), 5 cm hiatal hernia, erythema in the gastric antrum (erosion with inflammation but no H. pylori).  Normal duodenum  Last  Colonoscopy: 06/2021 - Presence of scars ?possible previous drainage site found on perianal exam. - Nodular ileal mucosa. Biopsied-normal - One 2 mm polyp in the cecum-normal tissue - The distal rectum and anal verge are normal on retroflexion view.  Repeat colonoscopy in march 2028   Past Medical History: Past Medical History:  Diagnosis Date   Crohn disease ([place])    Depression    Heart murmur    Medical history non-contributory    Post traumatic stress disorder (PTSD)    Seizures ([place])     Past Surgical History: Past Surgical History:  Procedure Laterality Date   ABDOMINAL SURGERY     stents   BIOPSY  06/21/2021   Procedure: BIOPSY;  Surgeon: Harvel Quale, MD;  [facility];  Service: Gastroenterology;;   BIOPSY  12/10/2021   Procedure: BIOPSY;  Surgeon: Harvel Quale, MD;  [facility];  Service: Gastroenterology;;   CHOLECYSTECTOMY     COLONOSCOPY WITH PROPOFOL N/A 06/21/2021   Procedure: COLONOSCOPY WITH PROPOFOL;  Surgeon: Harvel Quale, MD;  [facility];  Service: Gastroenterology;  Laterality: N/A;   ESOPHAGOGASTRODUODENOSCOPY (EGD) WITH PROPOFOL N/A 06/21/2021   Procedure: ESOPHAGOGASTRODUODENOSCOPY (EGD) WITH PROPOFOL;  Surgeon: Harvel Quale, MD;  [facility];  Service: Gastroenterology;  Laterality: N/A;  945   ESOPHAGOGASTRODUODENOSCOPY (EGD) WITH PROPOFOL N/A 12/10/2021   Procedure: ESOPHAGOGASTRODUODENOSCOPY (EGD) WITH PROPOFOL;  Surgeon: Harvel Quale, MD;  [facility];  Service: Gastroenterology;  Laterality: N/A;  2:00 ASA 2   KNEE SURGERY     POLYPECTOMY  06/21/2021   Procedure: POLYPECTOMY;  Surgeon: Harvel Quale, MD;  [facility];  Service: Gastroenterology;;   thumb surgery      Family History: Family History  Problem Relation Age of Onset   Hypertension Mother    Heart failure Mother    COPD Mother     Social  History: Social History   Tobacco Use  Smoking Status Every Day   Packs/day: 2.00   Types: Cigarettes  Smokeless Tobacco Never   Social History   Substance and Sexual Activity  Alcohol Use Not Currently   Alcohol/week: 84.0 standard drinks of alcohol   Types: 84 Cans of beer per week   Comment: nothing since January 2023   Social History   Substance and Sexual Activity  Drug Use Yes   Frequency: 2.0 times per week   Types: Marijuana   Comment: tuesday last    Allergies: Allergies  Allergen Reactions   Baclofen Swelling   Fentanyl Hives and Swelling   Onion Swelling   Oxycodone Other (See Comments)    Puts him in another world   Ultram [Tramadol] Nausea Only    Medications: Current Outpatient Medications  Medication Sig Dispense Refill   AIMOVIG 140 MG/ML SOAJ Inject 140 mg into the skin every 30 (thirty) days. 1.12 mL 11   busPIRone (BUSPAR) 15 MG tablet Take 15 mg by mouth 2 (two) times daily.     diazepam (VALIUM) 5 MG tablet Take 5 mg by mouth 2 (two) times daily.     divalproex (DEPAKOTE) 500 MG DR tablet Take 500 mg by mouth 3 (three) times daily.     doxepin (SINEQUAN) 10 MG capsule Take 10 mg by mouth at bedtime.     esomeprazole (NEXIUM) 40 MG capsule TAKE ONE CAPSULE BY MOUTH TWICE DAILY BEFORE A MEAL 60 capsule 1   Magnesium 400 MG TABS Take 400 mg by mouth daily.     NAYZILAM 5 MG/0.1ML SOLN Place 1 spray into both nostrils daily as needed (Seizures).     ondansetron (ZOFRAN-ODT) 4 MG disintegrating tablet TAKE ONE TABLET BY MOUTH EVERY 8 HOURS AS NEEDED FOR NAUSEA AND VOMITING 30 tablet 1   potassium chloride SA (KLOR-CON M) 20 MEQ tablet Take 20 mEq by mouth daily.     pregabalin (LYRICA) 100 MG capsule Take 100 mg by mouth 2 (two) times daily.     sucralfate (CARAFATE) 1 GM/10ML suspension TAKE TWO TEASPOONSFUL (10ML) BY MOUTH FOUR TIMES DAILY WITH MEALS AND AT BEDTIME 420 mL 1   No current facility-administered medications for this visit.     Review of Systems: GENERAL: negative for malaise, night sweats HEENT: No changes in hearing or vision, no nose bleeds or other nasal problems. NECK: Negative for lumps, goiter, pain and significant neck swelling RESPIRATORY: Negative for cough, wheezing CARDIOVASCULAR: Negative for chest pain, leg swelling, palpitations, orthopnea GI: SEE HPI MUSCULOSKELETAL: Negative for joint pain or swelling, back pain, and muscle pain. SKIN: Negative for lesions, rash PSYCH: Negative for sleep disturbance, mood disorder and recent psychosocial stressors. HEMATOLOGY Negative for prolonged bleeding, bruising easily, and swollen nodes. ENDOCRINE: Negative for cold or heat intolerance, polyuria, polydipsia and goiter. NEURO: negative for tremor, gait imbalance, syncope and seizures. The remainder of the review of systems is noncontributory.   Physical Exam: BP 118/77 (BP Location: Right Arm, Patient Position: Sitting, Cuff Size: Large)   Pulse (!) 104   Temp (!) 97.5 F (36.4 C) (Temporal)   Ht 5' 6"$  (1.676 m)   Wt 160 lb 11.2 oz (72.9 kg)   BMI 25.94 kg/m  GENERAL: The  patient is AO x3, in no acute distress. HEENT: Head is normocephalic and atraumatic. EOMI are intact. Mouth is well hydrated and without lesions. NECK: Supple. No masses LUNGS: Clear to auscultation. No presence of rhonchi/wheezing/rales. Adequate chest expansion HEART: RRR, normal s1 and s2. ABDOMEN: Soft, nontender, no guarding, no peritoneal signs, and nondistended. BS +. No masses. EXTREMITIES: Without any cyanosis, clubbing, rash, lesions or edema. NEUROLOGIC: AOx3, no focal motor deficit. SKIN: no jaundice, no rashes  Imaging/Labs: as above  I personally reviewed and interpreted the available labs, imaging and endoscopic files.  Impression and Plan: Joshua Delatte. is a 52 y.o. male with past medical history of PTSD, GERD complicated by esophagitis, gastritis, depression, seizures, who presents for follow up of  abdominal pain and nausea.  Patient has presented improvement of his nausea while taking Zofran as needed.  He had previous evidence of ongoing esophagitis, which likely was driving his nausea symptoms, but also was presenting some occasional heartburn which seems to be better controlled while taking Nexium 40 mg every day.  Will continue with this regimen for now.  Notably, he had a normal CT enterography and the rest of his endoscopic evaluation has been unremarkable.  I suspect that his chronic abdominal pain is functional in nature as he reports having this pain for multiple decades.  He will benefit from using Bentyl as needed to control his pain episodes. -Continue with Zofran as needed for nausea -Decrease Nexium to 40 mg every day -Continue Carafate as needed for heartburn -Start Bentyl 1 tablet q12h as needed for abdominal pain  All questions were answered.      Joshua Peppers, MD Gastroenterology and Hepatology [facility]

## 2022-05-26 NOTE — Patient Instructions (Signed)
Continue with Zofran as needed for nausea Decrease Nexium to 40 mg every day Continue Carafate as needed for heartburn Start Bentyl 1 tablet q12h as needed for abdominal pain

## 2022-06-16 ENCOUNTER — Ambulatory Visit: Payer: Medicare HMO | Admitting: Neurology

## 2022-06-16 ENCOUNTER — Encounter: Payer: Self-pay | Admitting: Neurology

## 2022-06-16 VITALS — BP 109/73 | HR 68 | Ht 66.0 in | Wt 153.4 lb

## 2022-06-16 DIAGNOSIS — Z5181 Encounter for therapeutic drug level monitoring: Secondary | ICD-10-CM | POA: Diagnosis not present

## 2022-06-16 DIAGNOSIS — G43701 Chronic migraine without aura, not intractable, with status migrainosus: Secondary | ICD-10-CM

## 2022-06-16 DIAGNOSIS — G40909 Epilepsy, unspecified, not intractable, without status epilepticus: Secondary | ICD-10-CM | POA: Diagnosis not present

## 2022-06-16 MED ORDER — SUMATRIPTAN SUCCINATE 50 MG PO TABS: 50.0000 mg | ORAL_TABLET | ORAL | 6 refills | Status: DC | PRN

## 2022-06-16 MED ORDER — DOXEPIN HCL 10 MG PO CAPS: 10.0000 mg | ORAL_CAPSULE | Freq: Every day | ORAL | 11 refills | Status: DC

## 2022-06-16 MED ORDER — DIVALPROEX SODIUM 500 MG PO DR TAB: 1000.0000 mg | DELAYED_RELEASE_TABLET | Freq: Two times a day (BID) | ORAL | 11 refills | Status: DC

## 2022-06-16 NOTE — Patient Instructions (Signed)
Increase Depakote to 1000 mg twice daily We will check Depakote level with LFTs Continue with Aimovig monthly for migraine prevention Try Imitrex 50 mg as needed for severe headache, can take a second dose 2 hours if the headaches are not improved but do not exceed more than 2 doses in a 24-hour Contact us for any breakthrough seizure or any other concerns Follow-up in 6 months or sooner if worse.

## 2022-06-16 NOTE — Progress Notes (Signed)
[facility]  PATIENT: Joshua Hale. DOB: 05-Nov-1970  REQUESTING CLINICIAN: Neale Burly, MD HISTORY FROM: Patient and wife  REASON FOR VISIT: Seizure/Migraines headaches    HISTORICAL  CHIEF COMPLAINT:  Chief Complaint  Patient presents with   Follow-up    Rm 17. Accompanied by wife. Wife reports a few breakthrough seizures that required the use of Nayzilam. He gets more migraines the week before his Aimovig injections. C/o dizzy spells. Would like to discuss Doxepin.     INTERVAL HISTORY 06/16/2022 Joshua Hale presents today for follow-up, he is accompanied by wife.  Last visit was in August.  At that time we increased Depakote to 500 mg 3 times daily and check his LFTs.  His Depakote level was 71 and his LFTs were not elevated.   He also completed a routine EEG which was normal. Since then wife reported breakthrough seizures, he had a total of 4-5 seizures, some required Nayzilam.  They did not go to the hospital and they did not contact his office to let us know.  Currently his main complaint is headaches.  He reported the week prior to his next Aimovig injection is due he will have severe headaches.  He is not on any abortive medication.  He usually take Advil for the pain. He reports having this pain for the past 3 days.  He also reports running out of Doxepin, used it as a sleep aid and needs refills. No other complaints.    HISTORY OF PRESENT ILLNESS:  This is a 52 year old man with PMHx including migraines headaches on Aimovig, Seizure disorder that started last November, and chronic pain syndrome who is presenting as a transfer of care. Patient reports that seizures started last November, described as generalized convulsion with drooling, tongue biting and urinary incontinence. He reports frequency improved after started Depakote but still having about one seizure per month. Reports history of head trauma including concussion, history of alcohol abuse but no  other seizure risk factors.    Handedness: Rght handed   Onset: November of last year  Seizure Type: Generalized convulsion  Current frequency: Once a month   Any injuries from seizures: Tongue biting  Seizure risk factors: head trauma, quit drinking 7 months ago,   Previous ASMs: Depakote   Currenty ASMs: Depakote 500 mg TID   ASMs side effects: Denies   Brain Images: Normal Brain MRI   Previous EEGs: Normal EEG    OTHER MEDICAL CONDITIONS: Chronic pain syndrome, Migraines  REVIEW OF SYSTEMS: Full 14 system review of systems performed and negative with exception of: As noted in the HPI   ALLERGIES: Allergies  Allergen Reactions   Baclofen Swelling   Fentanyl Hives and Swelling   Morphine Other (See Comments)    sleepy   Onion Swelling   Oxycodone Other (See Comments)    Puts him in another world   Oxymorphone Other (See Comments)    Not oxycodone per wife   Ultram [Tramadol] Nausea Only    HOME MEDICATIONS: Outpatient Medications Prior to Visit  Medication Sig Dispense Refill   AIMOVIG 140 MG/ML SOAJ Inject 140 mg into the skin every 30 (thirty) days. 1.12 mL 11   busPIRone (BUSPAR) 15 MG tablet Take 15 mg by mouth 2 (two) times daily.     diazepam (VALIUM) 5 MG tablet Take 5 mg by mouth 2 (two) times daily.     dicyclomine (BENTYL) 10 MG capsule Take 1 capsule (10 mg total) by mouth  every 12 (twelve) hours as needed for spasms (abdominal pain). 60 capsule 5   esomeprazole (NEXIUM) 40 MG capsule TAKE ONE CAPSULE BY MOUTH TWICE DAILY BEFORE A MEAL 60 capsule 1   Magnesium 400 MG TABS Take 400 mg by mouth daily.     NAYZILAM 5 MG/0.1ML SOLN Place 1 spray into both nostrils daily as needed (Seizures).     ondansetron (ZOFRAN-ODT) 4 MG disintegrating tablet TAKE ONE TABLET BY MOUTH EVERY 8 HOURS AS NEEDED FOR NAUSEA AND VOMITING 30 tablet 1   potassium chloride SA (KLOR-CON M) 20 MEQ tablet Take 20 mEq by mouth daily.     pregabalin (LYRICA) 100 MG capsule Take  100 mg by mouth 2 (two) times daily.     sucralfate (CARAFATE) 1 GM/10ML suspension TAKE TWO TEASPOONSFUL (10ML) BY MOUTH FOUR TIMES DAILY WITH MEALS AND AT BEDTIME 420 mL 1   divalproex (DEPAKOTE) 500 MG DR tablet Take 500 mg by mouth 3 (three) times daily.     doxepin (SINEQUAN) 10 MG capsule Take 10 mg by mouth at bedtime.     No facility-administered medications prior to visit.    PAST MEDICAL HISTORY: Past Medical History:  Diagnosis Date   Crohn disease ([place])    Depression    Heart murmur    Medical history non-contributory    Post traumatic stress disorder (PTSD)    Seizures ([place])     PAST SURGICAL HISTORY: Past Surgical History:  Procedure Laterality Date   ABDOMINAL SURGERY     stents   BIOPSY  06/21/2021   Procedure: BIOPSY;  Surgeon: Harvel Quale, MD;  [facility];  Service: Gastroenterology;;   BIOPSY  12/10/2021   Procedure: BIOPSY;  Surgeon: Harvel Quale, MD;  [facility];  Service: Gastroenterology;;   CHOLECYSTECTOMY     COLONOSCOPY WITH PROPOFOL N/A 06/21/2021   Procedure: COLONOSCOPY WITH PROPOFOL;  Surgeon: Harvel Quale, MD;  [facility];  Service: Gastroenterology;  Laterality: N/A;   ESOPHAGOGASTRODUODENOSCOPY (EGD) WITH PROPOFOL N/A 06/21/2021   Procedure: ESOPHAGOGASTRODUODENOSCOPY (EGD) WITH PROPOFOL;  Surgeon: Harvel Quale, MD;  [facility];  Service: Gastroenterology;  Laterality: N/A;  945   ESOPHAGOGASTRODUODENOSCOPY (EGD) WITH PROPOFOL N/A 12/10/2021   Procedure: ESOPHAGOGASTRODUODENOSCOPY (EGD) WITH PROPOFOL;  Surgeon: Harvel Quale, MD;  [facility];  Service: Gastroenterology;  Laterality: N/A;  2:00 ASA 2   KNEE SURGERY     POLYPECTOMY  06/21/2021   Procedure: POLYPECTOMY;  Surgeon: Montez Morita, Quillian Quince, MD;  [facility];  Service: Gastroenterology;;   thumb surgery      FAMILY HISTORY: Family History   Problem Relation Age of Onset   Hypertension Mother    Heart failure Mother    COPD Mother     SOCIAL HISTORY: Social History   Socioeconomic History   Marital status: Married    Spouse name: Not on file   Number of children: Not on file   Years of education: Not on file   Highest education level: Not on file  Occupational History   Not on file  Tobacco Use   Smoking status: Every Day    Packs/day: 2.00    Types: Cigarettes   Smokeless tobacco: Never  Vaping Use   Vaping Use: Never used  Substance and Sexual Activity   Alcohol use: Not Currently    Alcohol/week: 84.0 standard drinks of alcohol    Types: 84 Cans of beer per week  Comment: nothing since January 2023   Drug use: Yes    Frequency: 2.0 times per week    Types: Marijuana    Comment: tuesday last   Sexual activity: Yes  Other Topics Concern   Not on file  Social History Narrative   Not on file   Social Determinants of Health   Financial Resource Strain: Not on file  Food Insecurity: Not on file  Transportation Needs: Not on file  Physical Activity: Not on file  Stress: Not on file  Social Connections: Not on file  Intimate Partner Violence: Not on file    PHYSICAL EXAM  GENERAL EXAM/CONSTITUTIONAL: Vitals:  Vitals:   06/16/22 1144  BP: 109/73  Pulse: 68  Weight: 153 lb 6.4 oz (69.6 kg)  Height: '5\' 6"'$  (1.676 m)   Body mass index is 24.76 kg/m. Wt Readings from Last 3 Encounters:  06/16/22 153 lb 6.4 oz (69.6 kg)  05/26/22 160 lb 11.2 oz (72.9 kg)  11/26/21 154 lb (69.9 kg)   Patient is in no distress; well developed, poorly groomed and disheveled, neck is supple  EYES: Visual fields full to confrontation, Extraocular movements intacts,  No results found.  MUSCULOSKELETAL: Gait, strength, tone, movements noted in Neurologic exam below  NEUROLOGIC: MENTAL STATUS:      No data to display         awake, alert, oriented to person, place and time recent and remote memory  intact normal attention and concentration language fluent, comprehension intact, naming intact fund of knowledge appropriate  CRANIAL NERVE:  2nd, 3rd, 4th, 6th - pupils equal and reactive to light, visual fields full to confrontation, extraocular muscles intact, no nystagmus 5th - facial sensation symmetric 7th - facial strength symmetric 8th - hearing intact 9th - palate elevates symmetrically, uvula midline 11th - shoulder shrug symmetric 12th - tongue protrusion midline  MOTOR:  normal bulk and tone, full strength in the BUE, BLE  SENSORY:  normal and symmetric to light touch  COORDINATION:  finger-nose-finger, fine finger movements normal  GAIT/STATION:  Antalgic gait. History of left knee pain and previous surgeries     DIAGNOSTIC DATA (LABS, IMAGING, TESTING) - I reviewed patient records, labs, notes, testing and imaging myself where available.  Lab Results  Component Value Date   WBC 8.9 11/26/2021   HGB 13.8 11/26/2021   HCT 41.2 11/26/2021   MCV 89 11/26/2021   PLT 257 11/26/2021      Component Value Date/Time   NA 140 11/23/2010 1913   K 3.7 11/23/2010 1913   CL 105 11/23/2010 1913   CO2 22 11/23/2010 1913   GLUCOSE 89 11/23/2010 1913   BUN 7 11/23/2010 1913   CREATININE 0.82 11/23/2010 1913   CALCIUM 8.9 11/23/2010 1913   PROT 6.5 11/26/2021 1547   ALBUMIN 4.2 11/26/2021 1547   AST 18 11/26/2021 1547   ALT 22 11/26/2021 1547   ALKPHOS 166 (H) 11/26/2021 1547   BILITOT <0.2 11/26/2021 1547   GFRNONAA >60 11/23/2010 1913   GFRAA >60 11/23/2010 1913   No results found for: "CHOL", "HDL", "LDLCALC", "LDLDIRECT", "TRIG" No results found for: "HGBA1C" No results found for: "VITAMINB12" No results found for: "TSH"  MRI Brain 09/18/2021 No evidence of recent infarction, hemorrhage, or mass. No abnormal enhancement.  Routine EEG 12/17/2021 This is a normal EEG recording in the waking and drowsy state. No evidence of interictal epileptiform discharges  seen. A normal EEG does not exclude a diagnosis of epilepsy.    ASSESSMENT  AND PLAN  52 y.o. year old male  with seizure disorder and migraines here for follow up.  He is on Depakote 500 mg 3 times daily but do report breakthrough seizure.  At the moment we will increase Depakote to 1000 mg twice daily and advised him to contact me if he has another seizure.  At that time we will consider adding Keppra In terms of his headaches, he reported increased headaches the week prior to his next dose of Aimovig.  Currently he is having headache for the past 3 days.  He is not on any abortive medication we will try him on Imitrex.  I will see him in 6 months for follow-up or sooner if worse.   1. Seizure disorder ([place])   2. Chronic migraine without aura with status migrainosus, not intractable   3. Therapeutic drug monitoring      Patient Instructions  Increase Depakote to 1000 mg twice daily We will check Depakote level with LFTs Continue with Aimovig monthly for migraine prevention Try Imitrex 50 mg as needed for severe headache, can take a second dose 2 hours if the headaches are not improved but do not exceed more than 2 doses in a 24-hour Contact us for any breakthrough seizure or any other concerns Follow-up in 6 months or sooner if worse.   Per [facility] statutes, patients with seizures are not allowed to drive until they have been seizure-free for six months.  Other recommendations include using caution when using heavy equipment or power tools. Avoid working on ladders or at heights. Take showers instead of baths.  Do not swim alone.  Ensure the water temperature is not too high on the home water heater. Do not go swimming alone. Do not lock yourself in a room alone (i.e. bathroom). When caring for infants or small children, sit down when holding, feeding, or changing them to minimize risk of injury to the child in the event you have a seizure. Maintain good sleep hygiene. Avoid  alcohol.  Also recommend adequate sleep, hydration, good diet and minimize stress.   During the Seizure  - First, ensure adequate ventilation and place patients on the floor on their left side  Loosen clothing around the neck and ensure the airway is patent. If the patient is clenching the teeth, do not force the mouth open with any object as this can cause severe damage - Remove all items from the surrounding that can be hazardous. The patient may be oblivious to what's happening and may not even know what he or she is doing. If the patient is confused and wandering, either gently guide him/her away and block access to outside areas - Reassure the individual and be comforting - Call 911. In most cases, the seizure ends before EMS arrives. However, there are cases when seizures may last over 3 to 5 minutes. Or the individual may have developed breathing difficulties or severe injuries. If a pregnant patient or a person with diabetes develops a seizure, it is prudent to call an ambulance. - Finally, if the patient does not regain full consciousness, then call EMS. Most patients will remain confused for about 45 to 90 minutes after a seizure, so you must use judgment in calling for help. - Avoid restraints but make sure the patient is in a bed with padded side rails - Place the individual in a lateral position with the neck slightly flexed; this will help the saliva drain from the mouth and prevent the tongue  from falling backward - Remove all nearby furniture and other hazards from the area - Provide verbal assurance as the individual is regaining consciousness - Provide the patient with privacy if possible - Call for help and start treatment as ordered by the caregiver   After the Seizure (Postictal Stage)  After a seizure, most patients experience confusion, fatigue, muscle pain and/or a headache. Thus, one should permit the individual to sleep. For the next few days, reassurance is essential.  Being calm and helping reorient the person is also of importance.  Most seizures are painless and end spontaneously. Seizures are not harmful to others but can lead to complications such as stress on the lungs, brain and the heart. Individuals with prior lung problems may develop labored breathing and respiratory distress.     Orders Placed This Encounter  Procedures   Valproic acid level   Hepatic Function Panel   CBC (no diff)    Meds ordered this encounter  Medications   divalproex (DEPAKOTE) 500 MG DR tablet    Sig: Take 2 tablets (1,000 mg total) by mouth 2 (two) times daily.    Dispense:  120 tablet    Refill:  11   doxepin (SINEQUAN) 10 MG capsule    Sig: Take 1 capsule (10 mg total) by mouth at bedtime.    Dispense:  30 capsule    Refill:  11   SUMAtriptan (IMITREX) 50 MG tablet    Sig: Take 1 tablet (50 mg total) by mouth every 2 (two) hours as needed for migraine. May repeat in 2 hours if headache persists or recurs.    Dispense:  10 tablet    Refill:  6    Return in about 6 months (around 12/17/2022).    Alric Ran, MD 06/16/2022, 12:53 PM  [facility] [address] [place], [place] [address] [phone] )-

## 2022-06-17 LAB — VALPROIC ACID LEVEL: Valproic Acid Lvl: 69 ug/mL (ref 50–100)

## 2022-06-17 LAB — CBC
Hematocrit: 43.5 % (ref 37.5–51.0)
Hemoglobin: 14.7 g/dL (ref 13.0–17.7)
MCH: 32.6 pg (ref 26.6–33.0)
MCHC: 33.8 g/dL (ref 31.5–35.7)
MCV: 97 fL (ref 79–97)
Platelets: 216 10*3/uL (ref 150–450)
RBC: 4.51 x10E6/uL (ref 4.14–5.80)
RDW: 13 % (ref 11.6–15.4)
WBC: 7.2 10*3/uL (ref 3.4–10.8)

## 2022-06-17 LAB — HEPATIC FUNCTION PANEL
ALT: 112 IU/L — ABNORMAL HIGH (ref 0–44)
AST: 144 IU/L — ABNORMAL HIGH (ref 0–40)
Albumin: 4.1 g/dL (ref 4.1–5.1)
Alkaline Phosphatase: 258 IU/L — ABNORMAL HIGH (ref 44–121)
Bilirubin Total: 0.2 mg/dL (ref 0.0–1.2)
Bilirubin, Direct: <0.1 mg/dL (ref 0.00–0.40)
Total Protein: 6.5 g/dL (ref 6.0–8.5)

## 2022-06-17 MED ORDER — NAYZILAM 5 MG/0.1ML NA SOLN: 5.0000 mg | NASAL | 5 refills | Status: DC | PRN

## 2022-06-17 NOTE — Addendum Note (Signed)
Addended byAlric Ran on: 06/17/2022 09:17 AM   Modules accepted: Orders

## 2022-06-24 ENCOUNTER — Encounter: Payer: Self-pay | Admitting: Neurology

## 2022-06-24 ENCOUNTER — Other Ambulatory Visit: Payer: Self-pay | Admitting: Neurology

## 2022-06-24 DIAGNOSIS — G40909 Epilepsy, unspecified, not intractable, without status epilepticus: Secondary | ICD-10-CM

## 2022-06-24 DIAGNOSIS — Z5181 Encounter for therapeutic drug level monitoring: Secondary | ICD-10-CM

## 2022-06-25 ENCOUNTER — Other Ambulatory Visit (INDEPENDENT_AMBULATORY_CARE_PROVIDER_SITE_OTHER): Payer: Self-pay

## 2022-06-25 DIAGNOSIS — G40909 Epilepsy, unspecified, not intractable, without status epilepticus: Secondary | ICD-10-CM

## 2022-06-25 DIAGNOSIS — Z5181 Encounter for therapeutic drug level monitoring: Secondary | ICD-10-CM

## 2022-06-25 DIAGNOSIS — Z0289 Encounter for other administrative examinations: Secondary | ICD-10-CM

## 2022-06-26 LAB — HEPATIC FUNCTION PANEL
ALT: 43 IU/L (ref 0–44)
AST: 65 IU/L — ABNORMAL HIGH (ref 0–40)
Albumin: 3.8 g/dL — ABNORMAL LOW (ref 4.1–5.1)
Alkaline Phosphatase: 169 IU/L — ABNORMAL HIGH (ref 44–121)
Bilirubin Total: <0.2 mg/dL (ref 0.0–1.2)
Bilirubin, Direct: <0.1 mg/dL (ref 0.00–0.40)
Total Protein: 6.1 g/dL (ref 6.0–8.5)

## 2022-06-26 LAB — VALPROIC ACID LEVEL: Valproic Acid Lvl: 79 ug/mL (ref 50–100)

## 2022-07-01 ENCOUNTER — Telehealth: Payer: Self-pay | Admitting: Neurology

## 2022-07-01 ENCOUNTER — Other Ambulatory Visit (INDEPENDENT_AMBULATORY_CARE_PROVIDER_SITE_OTHER): Payer: Self-pay | Admitting: Gastroenterology

## 2022-07-01 ENCOUNTER — Other Ambulatory Visit: Payer: Self-pay | Admitting: Neurology

## 2022-07-01 DIAGNOSIS — R112 Nausea with vomiting, unspecified: Secondary | ICD-10-CM

## 2022-07-01 MED ORDER — ZONISAMIDE 100 MG PO CAPS
300.0000 mg | ORAL_CAPSULE | Freq: Every evening | ORAL | 5 refills | Status: DC
Start: 2022-07-01 — End: 2022-12-18

## 2022-07-01 NOTE — Telephone Encounter (Signed)
Pt's wife called stating that the pt is still having hallucinations about every other day. She would like to discuss with RN or MD.

## 2022-07-01 NOTE — Telephone Encounter (Signed)
I called patient. I spoke with patient's wife, Olin Hauser, per DPR. Patient's wife is reporting "dreaming hallucinations" while he is asleep, almost every other night for the past week. Patient's wife has to watch him during these episodes to ensure he doesn't harm himself.   It appears that Dr. April Manson attempted to reach them yesterday to discuss. Patient's wife reports that she was asleep and would al ike a call back at [phone].

## 2022-07-15 ENCOUNTER — Other Ambulatory Visit (INDEPENDENT_AMBULATORY_CARE_PROVIDER_SITE_OTHER): Payer: Self-pay | Admitting: Gastroenterology

## 2022-07-17 ENCOUNTER — Ambulatory Visit (INDEPENDENT_AMBULATORY_CARE_PROVIDER_SITE_OTHER): Payer: Medicare HMO | Admitting: Orthopaedic Surgery

## 2022-07-17 ENCOUNTER — Other Ambulatory Visit (INDEPENDENT_AMBULATORY_CARE_PROVIDER_SITE_OTHER): Payer: Medicare HMO

## 2022-07-17 ENCOUNTER — Encounter: Payer: Self-pay | Admitting: Orthopaedic Surgery

## 2022-07-17 VITALS — Ht 66.0 in | Wt 152.0 lb

## 2022-07-17 DIAGNOSIS — G8929 Other chronic pain: Secondary | ICD-10-CM | POA: Diagnosis not present

## 2022-07-17 DIAGNOSIS — M659 Synovitis and tenosynovitis, unspecified: Secondary | ICD-10-CM | POA: Diagnosis not present

## 2022-07-17 DIAGNOSIS — M25562 Pain in left knee: Secondary | ICD-10-CM

## 2022-07-17 MED ORDER — METHYLPREDNISOLONE ACETATE 40 MG/ML IJ SUSP
40.0000 mg | INTRAMUSCULAR | Status: AC | PRN
  Administered 2022-07-17: 40 mg via INTRA_ARTICULAR

## 2022-07-17 MED ORDER — LIDOCAINE HCL 1 % IJ SOLN
0.5000 mL | INTRAMUSCULAR | Status: AC | PRN
  Administered 2022-07-17: .5 mL

## 2022-07-17 MED ORDER — BUPIVACAINE HCL 0.5 % IJ SOLN
3.0000 mL | INTRAMUSCULAR | Status: AC | PRN
  Administered 2022-07-17: 3 mL via INTRA_ARTICULAR

## 2022-07-17 NOTE — Progress Notes (Signed)
Office Visit Note   Patient: Joshua Hale.           Date of Birth: 07/29/1970           MRN: [id number] Visit Date: 07/17/2022              Requested by: Neale Burly, MD Portal,  [place] [id number] PCP: Neale Burly, MD   Assessment & Plan: Visit Diagnoses:  1. Chronic pain of left knee   2. Synovitis of left knee     Plan: Left knee injection performed which she tolerated with some pain problems.  She will follow-up with me as needed.  Hopefully will get some relief with this.  Discussed with patient and his wife that he does not have significant arthritis in his knees at this point.  He has ongoing problems and MRI scan can be considered after 6 to 8 weeks.  Follow-Up Instructions: No follow-ups on file.   Orders:  Orders Placed This Encounter  Procedures   Large Joint Inj: L knee   XR KNEE 3 VIEW LEFT   No orders of the defined types were placed in this encounter.     Procedures: Large Joint Inj: L knee on 07/17/2022 11:11 AM Indications: joint swelling and pain Details: 22 G 1.5 in needle, anterolateral approach  Arthrogram: No  Medications: 0.5 mL lidocaine 1 %; 3 mL bupivacaine 0.5 %; 40 mg methylPREDNISolone acetate 40 MG/ML Outcome: tolerated well, no immediate complications Procedure, treatment alternatives, risks and benefits explained, specific risks discussed. Consent was given by the patient. Immediately prior to procedure a time out was called to verify the correct patient, procedure, equipment, support staff and site/side marked as required. Patient was prepped and draped in the usual sterile fashion.       Clinical Data: No additional findings.   Subjective: Chief Complaint  Patient presents with   Left Knee - Pain    HPI 52 year old male here with his wife on disability with history of epilepsy and previous left knee surgery is seen with complaints of bilateral knee pain worse on the left than right.  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  No recent falls.  Chart review shows history of back pain Dupuytren's disease alcohol withdrawal seizures and calcaneus fracture.  He is also had a history of epilepsy.  Previous surgery with Dr. Luna Glasgow age 19 may be knee arthroscopy.  Later had a tibial tubercle elevation osteotomy by Dr. Sallee Provencal.  He states he has good range of motion of his knees but they constantly ache.  No locking.  No history of gout.  He has been taking ibuprofen 2 every 4-6 hours.  Patient is on diazepam 10 mg twice daily and Lyrica chronically.  Review of Systems all systems update noncontributory to HPI.   Objective: Vital Signs: Ht 5\' 6"  (1.676 m)   Wt 152 lb (68.9 kg)   BMI 24.53 kg/m   Physical Exam Constitutional:      Appearance: He is well-developed.  HENT:     Head: Normocephalic and atraumatic.     Right Ear: External ear normal.     Left Ear: External ear normal.  Eyes:     Pupils: Pupils are equal, round, and reactive to light.  Neck:     Thyroid: No thyromegaly.     Trachea: No tracheal deviation.  Cardiovascular:     Rate and Rhythm: Normal rate.  Pulmonary:  Effort: Pulmonary effort is normal.     Breath sounds: No wheezing.  Abdominal:     General: Bowel sounds are normal.     Palpations: Abdomen is soft.  Musculoskeletal:     Cervical back: Neck supple.  Skin:    General: Skin is warm and dry.     Capillary Refill: Capillary refill takes less than 2 seconds.  Neurological:     Mental Status: He is alert and oriented to person, place, and time.  Psychiatric:        Behavior: Behavior normal.        Thought Content: Thought content normal.        Judgment: Judgment normal.     Ortho Exam: Patient has prominence of the left tibial tubercle no motion no knee effusion collateral ligaments are stable mild crepitus with knee extension both right and left knee slightly worse left than right.  Negative logroll to hips negative positive compression  test negative straight leg raising 90 degrees.  Is able to heel and toe walk.  Has slight waddle when he walks notes specific knee limp.  Knee does reach full extension.  Specialty Comments:  No specialty comments available.  Imaging: XR KNEE 3 VIEW LEFT  Result Date: 07/17/2022 Standing AP both knees lateral left knee sunrise patella x-ray obtained.  This shows a patellofemoral degenerative changes left knee previous [place] procedure with tibial tubercle osteotomy and single screw fixation.  Trace patella [place] on left versus right knee.  No specific medial or lateral joint line narrowing either knee no subchondral sclerosis no spurring. Impression: Left patellofemoral chondromalacia of mild degenerative changes with sparing of medial lateral compartment.  Negative for acute changes.    PMFS History: Patient Active Problem List   Diagnosis Date Noted   Chronic abdominal pain 05/26/2022   Chronic nausea 05/26/2022   Dupuytren's disease 11/14/2021   Gastroesophageal reflux disease with esophagitis without hemorrhage 08/20/2021   Abdominal pain, chronic, epigastric 05/21/2021   Early satiety 05/21/2021   Dysphagia 05/21/2021   Burning sensation of throat 05/21/2021   Alcohol withdrawal seizure 02/02/2021   Chronic low back pain 02/02/2021   Long-term current use of opiate analgesic 02/02/2021   Chronic migraine without aura, with status migrainosus 01/18/2020   Heel fracture 05/22/2015   SHOULDER INSTABILITY 03/22/2008   SHOULDER PAIN 03/22/2008   Past Medical History:  Diagnosis Date   Crohn disease    Depression    Heart murmur    Medical history non-contributory    Post traumatic stress disorder (PTSD)    Seizures     Family History  Problem Relation Age of Onset   Hypertension Mother    Heart failure Mother    COPD Mother     Past Surgical History:  Procedure Laterality Date   ABDOMINAL SURGERY     stents   BIOPSY  06/21/2021   Procedure: BIOPSY;  Surgeon: Harvel Quale, MD;  [facility];  Service: Gastroenterology;;   BIOPSY  12/10/2021   Procedure: BIOPSY;  Surgeon: Harvel Quale, MD;  [facility];  Service: Gastroenterology;;   CHOLECYSTECTOMY     COLONOSCOPY WITH PROPOFOL N/A 06/21/2021   Procedure: COLONOSCOPY WITH PROPOFOL;  Surgeon: Harvel Quale, MD;  [facility];  Service: Gastroenterology;  Laterality: N/A;   ESOPHAGOGASTRODUODENOSCOPY (EGD) WITH PROPOFOL N/A 06/21/2021   Procedure: ESOPHAGOGASTRODUODENOSCOPY (EGD) WITH PROPOFOL;  Surgeon: Harvel Quale, MD;  [facility];  Service: Gastroenterology;  Laterality:  N/A;  945   ESOPHAGOGASTRODUODENOSCOPY (EGD) WITH PROPOFOL N/A 12/10/2021   Procedure: ESOPHAGOGASTRODUODENOSCOPY (EGD) WITH PROPOFOL;  Surgeon: Harvel Quale, MD;  [facility];  Service: Gastroenterology;  Laterality: N/A;  2:00 ASA 2   KNEE SURGERY     POLYPECTOMY  06/21/2021   Procedure: POLYPECTOMY;  Surgeon: Montez Morita, Quillian Quince, MD;  [facility];  Service: Gastroenterology;;   thumb surgery     Social History   Occupational History   Not on file  Tobacco Use   Smoking status: Every Day    Packs/day: 2    Types: Cigarettes   Smokeless tobacco: Never  Vaping Use   Vaping Use: Never used  Substance and Sexual Activity   Alcohol use: Not Currently    Alcohol/week: 84.0 standard drinks of alcohol    Types: 84 Cans of beer per week    Comment: nothing since January 2023   Drug use: Yes    Frequency: 2.0 times per week    Types: Marijuana    Comment: tuesday last   Sexual activity: Yes

## 2022-07-21 DIAGNOSIS — I10 Essential (primary) hypertension: Secondary | ICD-10-CM | POA: Diagnosis not present

## 2022-07-21 DIAGNOSIS — M25561 Pain in right knee: Secondary | ICD-10-CM | POA: Diagnosis not present

## 2022-07-21 DIAGNOSIS — G40919 Epilepsy, unspecified, intractable, without status epilepticus: Secondary | ICD-10-CM | POA: Diagnosis not present

## 2022-07-21 DIAGNOSIS — F411 Generalized anxiety disorder: Secondary | ICD-10-CM | POA: Diagnosis not present

## 2022-07-21 DIAGNOSIS — Z6823 Body mass index (BMI) 23.0-23.9, adult: Secondary | ICD-10-CM | POA: Diagnosis not present

## 2022-07-21 DIAGNOSIS — Z125 Encounter for screening for malignant neoplasm of prostate: Secondary | ICD-10-CM | POA: Diagnosis not present

## 2022-07-21 DIAGNOSIS — Z0000 Encounter for general adult medical examination without abnormal findings: Secondary | ICD-10-CM | POA: Diagnosis not present

## 2022-09-17 DIAGNOSIS — Z0000 Encounter for general adult medical examination without abnormal findings: Secondary | ICD-10-CM | POA: Diagnosis not present

## 2022-09-17 DIAGNOSIS — I10 Essential (primary) hypertension: Secondary | ICD-10-CM | POA: Diagnosis not present

## 2022-09-17 DIAGNOSIS — F411 Generalized anxiety disorder: Secondary | ICD-10-CM | POA: Diagnosis not present

## 2022-09-17 DIAGNOSIS — G40919 Epilepsy, unspecified, intractable, without status epilepticus: Secondary | ICD-10-CM | POA: Diagnosis not present

## 2022-09-17 DIAGNOSIS — M25561 Pain in right knee: Secondary | ICD-10-CM | POA: Diagnosis not present

## 2022-09-17 DIAGNOSIS — Z6822 Body mass index (BMI) 22.0-22.9, adult: Secondary | ICD-10-CM | POA: Diagnosis not present

## 2022-10-05 ENCOUNTER — Other Ambulatory Visit (INDEPENDENT_AMBULATORY_CARE_PROVIDER_SITE_OTHER): Payer: Self-pay | Admitting: Gastroenterology

## 2022-10-06 NOTE — Telephone Encounter (Signed)
Left message on patient vm, asked that he return call, as we have at the last office visit Dr.Castaneda on 05/26/2022 for Gerd decreased to once per day. Need to know how the patient is taking esomeprazole.

## 2022-10-08 NOTE — Telephone Encounter (Signed)
I called again and asked that the patient please return the call to the office.

## 2022-10-09 ENCOUNTER — Encounter (INDEPENDENT_AMBULATORY_CARE_PROVIDER_SITE_OTHER): Payer: Self-pay

## 2022-10-09 NOTE — Telephone Encounter (Signed)
I have sent the patient a My Chart message asking how he is taking medication, once per day or twice.

## 2022-11-24 DIAGNOSIS — T63461A Toxic effect of venom of wasps, accidental (unintentional), initial encounter: Secondary | ICD-10-CM | POA: Diagnosis not present

## 2022-11-24 DIAGNOSIS — R229 Localized swelling, mass and lump, unspecified: Secondary | ICD-10-CM | POA: Diagnosis not present

## 2022-11-27 DIAGNOSIS — G629 Polyneuropathy, unspecified: Secondary | ICD-10-CM | POA: Diagnosis not present

## 2022-11-27 DIAGNOSIS — F411 Generalized anxiety disorder: Secondary | ICD-10-CM | POA: Diagnosis not present

## 2022-11-27 DIAGNOSIS — I10 Essential (primary) hypertension: Secondary | ICD-10-CM | POA: Diagnosis not present

## 2022-11-27 DIAGNOSIS — M25561 Pain in right knee: Secondary | ICD-10-CM | POA: Diagnosis not present

## 2022-11-27 DIAGNOSIS — Z6823 Body mass index (BMI) 23.0-23.9, adult: Secondary | ICD-10-CM | POA: Diagnosis not present

## 2022-11-27 DIAGNOSIS — G40919 Epilepsy, unspecified, intractable, without status epilepticus: Secondary | ICD-10-CM | POA: Diagnosis not present

## 2022-12-06 ENCOUNTER — Other Ambulatory Visit (INDEPENDENT_AMBULATORY_CARE_PROVIDER_SITE_OTHER): Payer: Self-pay | Admitting: Gastroenterology

## 2022-12-06 DIAGNOSIS — G8929 Other chronic pain: Secondary | ICD-10-CM

## 2022-12-18 ENCOUNTER — Other Ambulatory Visit: Payer: Self-pay | Admitting: Neurology

## 2022-12-23 ENCOUNTER — Ambulatory Visit: Payer: Medicare HMO | Admitting: Neurology

## 2022-12-23 ENCOUNTER — Encounter: Payer: Self-pay | Admitting: Neurology

## 2022-12-23 VITALS — BP 122/64 | Ht 66.0 in | Wt 136.0 lb

## 2022-12-23 DIAGNOSIS — G40909 Epilepsy, unspecified, not intractable, without status epilepticus: Secondary | ICD-10-CM

## 2022-12-23 DIAGNOSIS — G43701 Chronic migraine without aura, not intractable, with status migrainosus: Secondary | ICD-10-CM

## 2022-12-23 DIAGNOSIS — Z5181 Encounter for therapeutic drug level monitoring: Secondary | ICD-10-CM

## 2022-12-23 MED ORDER — AIMOVIG 140 MG/ML ~~LOC~~ SOAJ: 140.0000 mg | SUBCUTANEOUS | 11 refills | Status: DC

## 2022-12-23 MED ORDER — SUMATRIPTAN SUCCINATE 50 MG PO TABS: 50.0000 mg | ORAL_TABLET | ORAL | 6 refills | Status: DC | PRN

## 2022-12-23 MED ORDER — DIVALPROEX SODIUM 500 MG PO DR TAB: 500.0000 mg | DELAYED_RELEASE_TABLET | Freq: Three times a day (TID) | ORAL | 3 refills | Status: DC

## 2022-12-23 MED ORDER — ZONISAMIDE 100 MG PO CAPS: 100.0000 mg | ORAL_CAPSULE | Freq: Every evening | ORAL | 5 refills | Status: DC

## 2022-12-23 MED ORDER — PREGABALIN 100 MG PO CAPS: 100.0000 mg | ORAL_CAPSULE | Freq: Two times a day (BID) | ORAL | 5 refills | Status: DC

## 2022-12-23 NOTE — Progress Notes (Signed)
[facility]  PATIENT: Joshua Hale. DOB: 22-May-1970  REQUESTING CLINICIAN: Toma Deiters, MD HISTORY FROM: Patient and wife  REASON FOR VISIT: Seizure/Migraines headaches    HISTORICAL  CHIEF COMPLAINT:  Chief Complaint  Patient presents with   Follow-up    Rm 13, with wife Pam, no sz since last visit, but is having mood swings since starting the Zonegran, admits SI/HI at times, gets very agitated   INTERVAL HISTORY 12/23/2022:  Joshua Hale presents today for follow-up, he is accompanied by wife.  Last visit was in March at that time we checked the LFT and were still elevated.  We have decided to discontinue Depakote and start him on zonisamide 300 mg nightly.  While being off the Depakote and on zonisamide, he has not had any seizures but had worsening mood and irritability.  He reports increase depression, crying spell, being irritable to her seven months grandchild which makes him very, very upset and sad.  He does want to go back on Depakote because of his mood.  But again no seizure or seizure activity.  Reports the headaches are well-controlled on Aimovig.   INTERVAL HISTORY 06/16/2022 Joshua Hale presents today for follow-up, he is accompanied by wife.  Last visit was in August.  At that time we increased Depakote to 500 mg 3 times daily and check his LFTs.  His Depakote level was 71 and his LFTs were not elevated.   He also completed a routine EEG which was normal. Since then wife reported breakthrough seizures, he had a total of 4-5 seizures, some required Nayzilam.  They did not go to the hospital and they did not contact his office to let us know.  Currently his main complaint is headaches.  He reported the week prior to his next Aimovig injection is due he will have severe headaches.  He is not on any abortive medication.  He usually take Advil for the pain. He reports having this pain for the past 3 days.  He also reports running out of Doxepin, used it as a sleep aid  and needs refills. No other complaints.    HISTORY OF PRESENT ILLNESS:  This is a 52 year old man with PMHx including migraines headaches on Aimovig, Seizure disorder that started last November, and chronic pain syndrome who is presenting as a transfer of care. Patient reports that seizures started last November, described as generalized convulsion with drooling, tongue biting and urinary incontinence. He reports frequency improved after started Depakote but still having about one seizure per month. Reports history of head trauma including concussion, history of alcohol abuse but no other seizure risk factors.    Handedness: Rght handed   Onset: November of last year  Seizure Type: Generalized convulsion  Current frequency: Once a month   Any injuries from seizures: Tongue biting  Seizure risk factors: head trauma, quit drinking 7 months ago,   Previous ASMs: Depakote   Currenty ASMs: Zonisamide 300 mg nightly   ASMs side effects: Denies   Brain Images: Normal Brain MRI   Previous EEGs: Normal EEG    OTHER MEDICAL CONDITIONS: Chronic pain syndrome, Migraines  REVIEW OF SYSTEMS: Full 14 system review of systems performed and negative with exception of: As noted in the HPI   ALLERGIES: Allergies  Allergen Reactions   Baclofen Swelling   Fentanyl Hives and Swelling   Morphine Other (See Comments)    sleepy   Onion Swelling   Oxycodone Other (See Comments)  Puts him in another world   Oxymorphone Other (See Comments)    Not oxycodone per wife   Ultram [Tramadol] Nausea Only    HOME MEDICATIONS: Outpatient Medications Prior to Visit  Medication Sig Dispense Refill   busPIRone (BUSPAR) 15 MG tablet Take 15 mg by mouth 2 (two) times daily.     diazepam (VALIUM) 5 MG tablet Take 5 mg by mouth 2 (two) times daily.     dicyclomine (BENTYL) 10 MG capsule TAKE ONE CAPSULE BY MOUTH EVERY TWELVE HOURS AS NEEDED FOR ABDOMINAL FOLDS SPASMS/PAIN. 60 capsule 5   doxepin  (SINEQUAN) 10 MG capsule Take 1 capsule (10 mg total) by mouth at bedtime. 30 capsule 11   esomeprazole (NEXIUM) 40 MG capsule TAKE ONE CAPSULE BY MOUTH TWICE DAILY BEFORE A MEAL 60 capsule 1   Magnesium 400 MG TABS Take 400 mg by mouth daily.     NAYZILAM 5 MG/0.1ML SOLN Place 5 mg into both nostrils as needed (Seizures lasting more than 5 mintues). 5 each 5   ondansetron (ZOFRAN-ODT) 4 MG disintegrating tablet dissolve ONE TABLET BY MOUTH EVERY 8 HOURS AS NEEDED FOR NAUSEA AND VOMITING 30 tablet 1   potassium chloride SA (KLOR-CON M) 20 MEQ tablet Take 20 mEq by mouth daily.     sucralfate (CARAFATE) 1 GM/10ML suspension TAKE TWO TEASPOONSFUL ( ) BY MOUTH FOUR TIMES DAILY WITH MEALS AND AT BEDTIME 420 mL 1   AIMOVIG 140 MG/ML SOAJ Inject 140 mg into the skin every 30 (thirty) days. 1.12 mL 11   pregabalin (LYRICA) 100 MG capsule Take 100 mg by mouth 2 (two) times daily.     SUMAtriptan (IMITREX) 50 MG tablet Take 1 tablet (50 mg total) by mouth every 2 (two) hours as needed for migraine. May repeat in 2 hours if headache persists or recurs. 10 tablet 6   zonisamide (ZONEGRAN) 100 MG capsule TAKE THREE(3) CAPSULES BY MOUTH AT BEDTIME 90 capsule 5   No facility-administered medications prior to visit.    PAST MEDICAL HISTORY: Past Medical History:  Diagnosis Date   Crohn disease (HCC)    Depression    Heart murmur    Medical history non-contributory    Post traumatic stress disorder (PTSD)    Seizures (HCC)     PAST SURGICAL HISTORY: Past Surgical History:  Procedure Laterality Date   ABDOMINAL SURGERY     stents   BIOPSY  06/21/2021   Procedure: BIOPSY;  Surgeon: Dolores Frame, MD;  [facility]: [facility];  Service: Gastroenterology;;   BIOPSY  12/10/2021   Procedure: BIOPSY;  Surgeon: Dolores Frame, MD;  [facility]: [facility];  Service: Gastroenterology;;   CHOLECYSTECTOMY     COLONOSCOPY WITH PROPOFOL N/A 06/21/2021   Procedure: COLONOSCOPY WITH  PROPOFOL;  Surgeon: Dolores Frame, MD;  [facility]: [facility];  Service: Gastroenterology;  Laterality: N/A;   ESOPHAGOGASTRODUODENOSCOPY (EGD) WITH PROPOFOL N/A 06/21/2021   Procedure: ESOPHAGOGASTRODUODENOSCOPY (EGD) WITH PROPOFOL;  Surgeon: Dolores Frame, MD;  [facility]: [facility];  Service: Gastroenterology;  Laterality: N/A;  945   ESOPHAGOGASTRODUODENOSCOPY (EGD) WITH PROPOFOL N/A 12/10/2021   Procedure: ESOPHAGOGASTRODUODENOSCOPY (EGD) WITH PROPOFOL;  Surgeon: Dolores Frame, MD;  [facility]: [facility];  Service: Gastroenterology;  Laterality: N/A;  2:00 ASA 2   KNEE SURGERY     POLYPECTOMY  06/21/2021   Procedure: POLYPECTOMY;  Surgeon: Dolores Frame, MD;  [facility]: [facility];  Service: Gastroenterology;;   thumb surgery  FAMILY HISTORY: Family History  Problem Relation Age of Onset   Hypertension Mother    Heart failure Mother    COPD Mother     SOCIAL HISTORY: Social History   Socioeconomic History   Marital status: Married    Spouse name: Not on file   Number of children: Not on file   Years of education: Not on file   Highest education level: Not on file  Occupational History   Not on file  Tobacco Use   Smoking status: Every Day    Current packs/day: 2.00    Types: Cigarettes   Smokeless tobacco: Never  Vaping Use   Vaping status: Never Used  Substance and Sexual Activity   Alcohol use: Not Currently    Alcohol/week: 84.0 standard drinks of alcohol    Types: 84 Cans of beer per week    Comment: nothing since January 2023   Drug use: Yes    Frequency: 2.0 times per week    Types: Marijuana    Comment: tuesday last   Sexual activity: Yes  Other Topics Concern   Not on file  Social History Narrative   Right handed    Caffeine-none   Social Determinants of Health   Financial Resource Strain: Not on file  Food Insecurity: Not on file  Transportation Needs: Not on file  Physical Activity: Not  on file  Stress: Not on file  Social Connections: Not on file  Intimate Partner Violence: Not on file    PHYSICAL EXAM  GENERAL EXAM/CONSTITUTIONAL: Vitals:  Vitals:   12/23/22 0927  BP: 122/64  Weight: 136 lb (61.7 kg)  Height: 5\' 6"  (1.676 m)    Body mass index is 21.95 kg/m. Wt Readings from Last 3 Encounters:  12/23/22 136 lb (61.7 kg)  07/17/22 152 lb (68.9 kg)  06/16/22 153 lb 6.4 oz (69.6 kg)   Patient is in no distress; well developed, poorly groomed and disheveled, neck is supple. Appears anxious and crying.  MUSCULOSKELETAL: Gait, strength, tone, movements noted in Neurologic exam below  NEUROLOGIC: MENTAL STATUS:      No data to display         awake, alert, oriented to person, place and time recent and remote memory intact normal attention and concentration language fluent, comprehension intact, naming intact fund of knowledge appropriate  CRANIAL NERVE:  2nd, 3rd, 4th, 6th - visual fields full to confrontation, extraocular muscles intact, no nystagmus 5th - facial sensation symmetric 7th - facial strength symmetric 8th - hearing intact 9th - palate elevates symmetrically, uvula midline 11th - shoulder shrug symmetric 12th - tongue protrusion midline  MOTOR:  normal bulk and tone, full strength in the BUE, BLE  SENSORY:  normal and symmetric to light touch  COORDINATION:  finger-nose-finger, fine finger movements normal  GAIT/STATION:  Antalgic gait. History of left knee pain and previous surgeries     DIAGNOSTIC DATA (LABS, IMAGING, TESTING) - I reviewed patient records, labs, notes, testing and imaging myself where available.  Lab Results  Component Value Date   WBC 7.2 06/16/2022   HGB 14.7 06/16/2022   HCT 43.5 06/16/2022   MCV 97 06/16/2022   PLT 216 06/16/2022      Component Value Date/Time   NA 140 11/23/2010 1913   K 3.7 11/23/2010 1913   CL 105 11/23/2010 1913   CO2 22 11/23/2010 1913   GLUCOSE 89 11/23/2010 1913    BUN 7 11/23/2010 1913   CREATININE 0.82 11/23/2010 1913   CALCIUM  8.9 11/23/2010 1913   PROT 6.1 06/25/2022 1115   ALBUMIN 3.8 (L) 06/25/2022 1115   AST 65 (H) 06/25/2022 1115   ALT 43 06/25/2022 1115   ALKPHOS 169 (H) 06/25/2022 1115   BILITOT <0.2 06/25/2022 1115   GFRNONAA >60 11/23/2010 1913   GFRAA >60 11/23/2010 1913   No results found for: "CHOL", "HDL", "LDLCALC", "LDLDIRECT", "TRIG" No results found for: "HGBA1C" No results found for: "VITAMINB12" No results found for: "TSH"  MRI Brain 09/18/2021 No evidence of recent infarction, hemorrhage, or mass. No abnormal enhancement.  Routine EEG 12/17/2021 This is a normal EEG recording in the waking and drowsy state. No evidence of interictal epileptiform discharges seen. A normal EEG does not exclude a diagnosis of epilepsy.    ASSESSMENT AND PLAN  52 y.o. year old male  with seizure disorder and migraines here for follow up.  He was on Depakote 1000 mg twice daily but had elevated liver enzyme.  Depakote was discontinued and patient started on zonisamide.  Since discontinuation of Depakote and being on Zonisamide, he has not had any seizure or seizure activity but had worsening mood, depression, irritability and crying spell.  He definitely wants to go back on the Depakote.  Plan will be to check the LFTs today to obtain a baseline, restart patient on Depakote but his previous low-dose which was 500 mg 3 times daily and to continue monitoring his LFTs.  If they become elevated, I have informed him that we will have to decrease the Depakote and/or try a different medication for mood.  He voiced understanding.  He will stop by the office for in 1 month for lab work and at that time we will decide when to obtain the additional lab work.  I will see him physically in the office in a 78-months or sooner if worse.  He voiced understanding. Ask him to refrain from drinking alcohol while on the depakote. He voices understanding.    1. Seizure  disorder (HCC)   2. Chronic migraine without aura with status migrainosus, not intractable   3. Therapeutic drug monitoring       Patient Instructions  Restart Depakote 500 mg three time daily  Decrease Zonisamide to 100 mg nightly  Continue your other medications  Return in a month for additional lab work (Depakote level and liver function test)  Please contact us for any updates  Follow up in 6 months or sooner if worse    Per [facility] statutes, patients with seizures are not allowed to drive until they have been seizure-free for six months.  Other recommendations include using caution when using heavy equipment or power tools. Avoid working on ladders or at heights. Take showers instead of baths.  Do not swim alone.  Ensure the water temperature is not too high on the home water heater. Do not go swimming alone. Do not lock yourself in a room alone (i.e. bathroom). When caring for infants or small children, sit down when holding, feeding, or changing them to minimize risk of injury to the child in the event you have a seizure. Maintain good sleep hygiene. Avoid alcohol.  Also recommend adequate sleep, hydration, good diet and minimize stress.   During the Seizure  - First, ensure adequate ventilation and place patients on the floor on their left side  Loosen clothing around the neck and ensure the airway is patent. If the patient is clenching the teeth, do not force the mouth open with any object as  this can cause severe damage - Remove all items from the surrounding that can be hazardous. The patient may be oblivious to what's happening and may not even know what he or she is doing. If the patient is confused and wandering, either gently guide him/her away and block access to outside areas - Reassure the individual and be comforting - Call 911. In most cases, the seizure ends before EMS arrives. However, there are cases when seizures may last over 3 to 5 minutes. Or the  individual may have developed breathing difficulties or severe injuries. If a pregnant patient or a person with diabetes develops a seizure, it is prudent to call an ambulance. - Finally, if the patient does not regain full consciousness, then call EMS. Most patients will remain confused for about 45 to 90 minutes after a seizure, so you must use judgment in calling for help. - Avoid restraints but make sure the patient is in a bed with padded side rails - Place the individual in a lateral position with the neck slightly flexed; this will help the saliva drain from the mouth and prevent the tongue from falling backward - Remove all nearby furniture and other hazards from the area - Provide verbal assurance as the individual is regaining consciousness - Provide the patient with privacy if possible - Call for help and start treatment as ordered by the caregiver   After the Seizure (Postictal Stage)  After a seizure, most patients experience confusion, fatigue, muscle pain and/or a headache. Thus, one should permit the individual to sleep. For the next few days, reassurance is essential. Being calm and helping reorient the person is also of importance.  Most seizures are painless and end spontaneously. Seizures are not harmful to others but can lead to complications such as stress on the lungs, brain and the heart. Individuals with prior lung problems may develop labored breathing and respiratory distress.     Orders Placed This Encounter  Procedures   CMP   Valproic Acid Level   Hepatic Function Panel    Meds ordered this encounter  Medications   divalproex (DEPAKOTE) 500 MG DR tablet    Sig: Take 1 tablet (500 mg total) by mouth 3 (three) times daily.    Dispense:  270 tablet    Refill:  3   AIMOVIG 140 MG/ML SOAJ    Sig: Inject 140 mg into the skin every 30 (thirty) days.    Dispense:  1.12 mL    Refill:  11   SUMAtriptan (IMITREX) 50 MG tablet    Sig: Take 1 tablet (50 mg total) by  mouth every 2 (two) hours as needed for migraine. May repeat in 2 hours if headache persists or recurs.    Dispense:  10 tablet    Refill:  6   pregabalin (LYRICA) 100 MG capsule    Sig: Take 1 capsule (100 mg total) by mouth 2 (two) times daily.    Dispense:  60 capsule    Refill:  5   zonisamide (ZONEGRAN) 100 MG capsule    Sig: Take 1 capsule (100 mg total) by mouth at bedtime.    Dispense:  90 capsule    Refill:  5    Return in about 6 months (around 06/22/2023).    Windell Norfolk, MD 12/23/2022, 11:00 AM  [facility] [address], [address] [place], [place] [address] [phone] )-

## 2022-12-23 NOTE — Patient Instructions (Signed)
Restart Depakote 500 mg three time daily  Decrease Zonisamide to 100 mg nightly  Continue your other medications  Return in a month for additional lab work (Depakote level and liver function test)  Please contact us for any updates  Follow up in 6 months or sooner if worse

## 2022-12-24 LAB — COMPREHENSIVE METABOLIC PANEL
ALT: 13 IU/L (ref 0–44)
AST: 16 IU/L (ref 0–40)
Albumin: 4.4 g/dL (ref 3.8–4.9)
Alkaline Phosphatase: 61 IU/L (ref 44–121)
BUN/Creatinine Ratio: 8 — ABNORMAL LOW (ref 9–20)
BUN: 10 mg/dL (ref 6–24)
Bilirubin Total: 0.4 mg/dL (ref 0.0–1.2)
CO2: 22 mmol/L (ref 20–29)
Calcium: 9.5 mg/dL (ref 8.7–10.2)
Chloride: 101 mmol/L (ref 96–106)
Creatinine, Ser: 1.23 mg/dL (ref 0.76–1.27)
Globulin, Total: 2.6 g/dL (ref 1.5–4.5)
Glucose: 98 mg/dL (ref 70–99)
Potassium: 3.7 mmol/L (ref 3.5–5.2)
Sodium: 140 mmol/L (ref 134–144)
Total Protein: 7 g/dL (ref 6.0–8.5)
eGFR: 71 mL/min/{1.73_m2} (ref >59)

## 2023-01-02 ENCOUNTER — Other Ambulatory Visit: Payer: Self-pay | Admitting: Neurology

## 2023-01-19 ENCOUNTER — Other Ambulatory Visit: Payer: Self-pay | Admitting: Neurology

## 2023-01-22 ENCOUNTER — Telehealth: Payer: Self-pay | Admitting: Neurology

## 2023-01-22 ENCOUNTER — Other Ambulatory Visit (INDEPENDENT_AMBULATORY_CARE_PROVIDER_SITE_OTHER): Payer: Self-pay

## 2023-01-22 DIAGNOSIS — Z0289 Encounter for other administrative examinations: Secondary | ICD-10-CM

## 2023-01-22 DIAGNOSIS — G40909 Epilepsy, unspecified, not intractable, without status epilepticus: Secondary | ICD-10-CM | POA: Diagnosis not present

## 2023-01-22 DIAGNOSIS — Z5181 Encounter for therapeutic drug level monitoring: Secondary | ICD-10-CM

## 2023-01-22 NOTE — Telephone Encounter (Signed)
Pt came into office for labs. Stated that is having issues with the  zonisamide (ZONEGRAN) 100 MG capsule and would like to know if he should continue to take it.  Pt states the medication is causing him to be agitated and have really bad mood swings. Would like a call to discuss.  [phone]

## 2023-01-22 NOTE — Telephone Encounter (Signed)
Called and spoke to wife per DPR, who reports patient is getting extremely agitated at the simplest things at home and would like to change the zonegran or come off of it completely. He is only taking 100mg  at bedtime, they cannot contribute the agitation to anything other than the medication and would like recommendations on what to do. Wife reports no seizures and minimal migraines. IF medication is changed, they would like meds sent to the [facility] in [place]. I advised we would reach back out with recommendations for next steps.

## 2023-01-23 ENCOUNTER — Other Ambulatory Visit: Payer: Self-pay | Admitting: Neurology

## 2023-01-23 ENCOUNTER — Encounter: Payer: Self-pay | Admitting: Neurology

## 2023-01-23 DIAGNOSIS — Z5181 Encounter for therapeutic drug level monitoring: Secondary | ICD-10-CM

## 2023-01-23 DIAGNOSIS — G40909 Epilepsy, unspecified, not intractable, without status epilepticus: Secondary | ICD-10-CM

## 2023-01-23 LAB — HEPATIC FUNCTION PANEL
ALT: 44 [IU]/L (ref 0–44)
AST: 36 [IU]/L (ref 0–40)
Albumin: 4.3 g/dL (ref 3.8–4.9)
Alkaline Phosphatase: 138 [IU]/L — ABNORMAL HIGH (ref 44–121)
Bilirubin Total: 0.3 mg/dL (ref 0.0–1.2)
Bilirubin, Direct: 0.13 mg/dL (ref 0.00–0.40)
Total Protein: 6.7 g/dL (ref 6.0–8.5)

## 2023-01-23 LAB — VALPROIC ACID LEVEL: Valproic Acid Lvl: 79 ug/mL (ref 50–100)

## 2023-02-05 DIAGNOSIS — G629 Polyneuropathy, unspecified: Secondary | ICD-10-CM | POA: Diagnosis not present

## 2023-02-05 DIAGNOSIS — M25561 Pain in right knee: Secondary | ICD-10-CM | POA: Diagnosis not present

## 2023-02-05 DIAGNOSIS — F411 Generalized anxiety disorder: Secondary | ICD-10-CM | POA: Diagnosis not present

## 2023-02-05 DIAGNOSIS — I10 Essential (primary) hypertension: Secondary | ICD-10-CM | POA: Diagnosis not present

## 2023-02-05 DIAGNOSIS — G40919 Epilepsy, unspecified, intractable, without status epilepticus: Secondary | ICD-10-CM | POA: Diagnosis not present

## 2023-02-05 DIAGNOSIS — Z6821 Body mass index (BMI) 21.0-21.9, adult: Secondary | ICD-10-CM | POA: Diagnosis not present

## 2023-02-05 DIAGNOSIS — Z79899 Other long term (current) drug therapy: Secondary | ICD-10-CM | POA: Diagnosis not present

## 2023-02-05 DIAGNOSIS — E782 Mixed hyperlipidemia: Secondary | ICD-10-CM | POA: Diagnosis not present

## 2023-05-19 DIAGNOSIS — Z6825 Body mass index (BMI) 25.0-25.9, adult: Secondary | ICD-10-CM | POA: Diagnosis not present

## 2023-05-19 DIAGNOSIS — G40919 Epilepsy, unspecified, intractable, without status epilepticus: Secondary | ICD-10-CM | POA: Diagnosis not present

## 2023-05-19 DIAGNOSIS — M25561 Pain in right knee: Secondary | ICD-10-CM | POA: Diagnosis not present

## 2023-05-19 DIAGNOSIS — F411 Generalized anxiety disorder: Secondary | ICD-10-CM | POA: Diagnosis not present

## 2023-05-19 DIAGNOSIS — Z0000 Encounter for general adult medical examination without abnormal findings: Secondary | ICD-10-CM | POA: Diagnosis not present

## 2023-05-19 DIAGNOSIS — G629 Polyneuropathy, unspecified: Secondary | ICD-10-CM | POA: Diagnosis not present

## 2023-05-19 DIAGNOSIS — I10 Essential (primary) hypertension: Secondary | ICD-10-CM | POA: Diagnosis not present

## 2023-05-25 ENCOUNTER — Other Ambulatory Visit (INDEPENDENT_AMBULATORY_CARE_PROVIDER_SITE_OTHER): Payer: Self-pay | Admitting: Gastroenterology

## 2023-05-25 ENCOUNTER — Encounter: Payer: Self-pay | Admitting: Neurology

## 2023-05-25 DIAGNOSIS — G8929 Other chronic pain: Secondary | ICD-10-CM

## 2023-05-26 ENCOUNTER — Other Ambulatory Visit: Payer: Self-pay

## 2023-05-26 ENCOUNTER — Telehealth: Payer: Self-pay

## 2023-05-26 NOTE — Telephone Encounter (Signed)
Call to wife, no answer. Left message to return call

## 2023-05-26 NOTE — Telephone Encounter (Signed)
Patient states that Friday night he blacked out and then blacked out again last night. He states he hit his head but did not go to the ER. He states he does have a know on his head on left side above his ear and scratch on left forearm. He says he is not sure if was a seizure but everything went black. No one witnessed the se events. He denies missing any medications does. Taking Depakote 500 mg three times daily. Stopped Zonegran previously due to agitation. He denies alcohol use but does report sleep deprivation. Advised I would send to Dr. Teresa Coombs for review. He states he wasn't been able to get here for lab work in January.

## 2023-05-26 NOTE — Telephone Encounter (Signed)
Call to pharmacy to cancel Zonegran 100 mg. Spoke to [employer]

## 2023-05-26 NOTE — Telephone Encounter (Signed)
Continue with Depakote 500 mg TID and please have patient stop by the office for labs.

## 2023-05-26 NOTE — Telephone Encounter (Signed)
Returned phone call, would like a call back

## 2023-05-27 NOTE — Telephone Encounter (Signed)
Call to wife and patient, they will come by office for labs tomorrow and continue Depakote as prescribed.

## 2023-05-29 ENCOUNTER — Other Ambulatory Visit: Payer: Self-pay | Admitting: Neurology

## 2023-05-30 DIAGNOSIS — F17210 Nicotine dependence, cigarettes, uncomplicated: Secondary | ICD-10-CM | POA: Diagnosis not present

## 2023-05-30 DIAGNOSIS — M19042 Primary osteoarthritis, left hand: Secondary | ICD-10-CM | POA: Diagnosis not present

## 2023-05-30 DIAGNOSIS — S62311A Displaced fracture of base of second metacarpal bone. left hand, initial encounter for closed fracture: Secondary | ICD-10-CM | POA: Diagnosis not present

## 2023-05-30 DIAGNOSIS — Z885 Allergy status to narcotic agent status: Secondary | ICD-10-CM | POA: Diagnosis not present

## 2023-06-02 ENCOUNTER — Other Ambulatory Visit (INDEPENDENT_AMBULATORY_CARE_PROVIDER_SITE_OTHER): Payer: Self-pay

## 2023-06-02 DIAGNOSIS — G40909 Epilepsy, unspecified, not intractable, without status epilepticus: Secondary | ICD-10-CM

## 2023-06-02 DIAGNOSIS — Z0289 Encounter for other administrative examinations: Secondary | ICD-10-CM

## 2023-06-02 DIAGNOSIS — Z5181 Encounter for therapeutic drug level monitoring: Secondary | ICD-10-CM

## 2023-06-03 ENCOUNTER — Encounter: Payer: Self-pay | Admitting: Neurology

## 2023-06-03 LAB — COMPREHENSIVE METABOLIC PANEL
ALT: 41 [IU]/L (ref 0–44)
AST: 28 [IU]/L (ref 0–40)
Albumin: 4.4 g/dL (ref 3.8–4.9)
Alkaline Phosphatase: 213 [IU]/L — ABNORMAL HIGH (ref 44–121)
BUN/Creatinine Ratio: 13 (ref 9–20)
BUN: 13 mg/dL (ref 6–24)
Bilirubin Total: 0.5 mg/dL (ref 0.0–1.2)
CO2: 26 mmol/L (ref 20–29)
Calcium: 9.3 mg/dL (ref 8.7–10.2)
Chloride: 96 mmol/L (ref 96–106)
Creatinine, Ser: 0.99 mg/dL (ref 0.76–1.27)
Globulin, Total: 2.7 g/dL (ref 1.5–4.5)
Glucose: 81 mg/dL (ref 70–99)
Potassium: 4 mmol/L (ref 3.5–5.2)
Sodium: 139 mmol/L (ref 134–144)
Total Protein: 7.1 g/dL (ref 6.0–8.5)
eGFR: 92 mL/min/{1.73_m2} (ref >59)

## 2023-06-03 LAB — VALPROIC ACID LEVEL: Valproic Acid Lvl: 53 ug/mL (ref 50–100)

## 2023-06-05 DIAGNOSIS — S62313A Displaced fracture of base of third metacarpal bone, left hand, initial encounter for closed fracture: Secondary | ICD-10-CM | POA: Diagnosis not present

## 2023-06-05 DIAGNOSIS — S62311A Displaced fracture of base of second metacarpal bone. left hand, initial encounter for closed fracture: Secondary | ICD-10-CM | POA: Diagnosis not present

## 2023-06-05 DIAGNOSIS — M79642 Pain in left hand: Secondary | ICD-10-CM | POA: Diagnosis not present

## 2023-06-10 DIAGNOSIS — M79642 Pain in left hand: Secondary | ICD-10-CM | POA: Diagnosis not present

## 2023-06-17 DIAGNOSIS — X58XXXA Exposure to other specified factors, initial encounter: Secondary | ICD-10-CM | POA: Diagnosis not present

## 2023-06-17 DIAGNOSIS — S62311A Displaced fracture of base of second metacarpal bone. left hand, initial encounter for closed fracture: Secondary | ICD-10-CM | POA: Diagnosis not present

## 2023-06-17 DIAGNOSIS — S62313A Displaced fracture of base of third metacarpal bone, left hand, initial encounter for closed fracture: Secondary | ICD-10-CM | POA: Diagnosis not present

## 2023-06-19 ENCOUNTER — Other Ambulatory Visit: Payer: Self-pay | Admitting: Neurology

## 2023-06-22 NOTE — Telephone Encounter (Signed)
 Last seen 12/23/22, next appt scheduled 06/23/23  Dispenses    Dispensed Days Supply Quantity Provider Pharmacy  pregabalin 100 mg capsule 06/19/2023 30 60 capsule Windell Norfolk, MD [facility]  pregabalin 100 mg capsule 05/20/2023 30 60 capsule Windell Norfolk, MD [facility]  pregabalin 100 mg capsule 04/20/2023 30 60 capsule Windell Norfolk, MD [facility]  pregabalin 100 mg capsule 03/20/2023 30 60 capsule Windell Norfolk, MD [facility]  pregabalin 100 mg capsule 02/18/2023 30 60 capsule Windell Norfolk, MD [facility]  pregabalin 100 mg capsule 01/17/2023 30 60 capsule Windell Norfolk, MD [facility]  pregabalin 100 mg capsule 12/20/2022 30 60 capsule Hasanaj, Myra Gianotti, MD [facility]  pregabalin 100 mg capsule 11/17/2022 30 60 capsule Hasanaj, Myra Gianotti, MD [facility]  pregabalin 100 mg capsule 10/18/2022 30 60 capsule Hasanaj, Myra Gianotti, MD [facility]  pregabalin 100 mg capsule 09/18/2022 30 60 capsule Hasanaj, Myra Gianotti, MD [facility]  pregabalin 100 mg capsule 08/20/2022 30 60 each Hasanaj, Myra Gianotti, MD [facility]  pregabalin 100 mg capsule 07/19/2022 30 60 each Hasanaj, Myra Gianotti, MD [facility]

## 2023-06-23 ENCOUNTER — Ambulatory Visit: Payer: Medicare HMO | Admitting: Neurology

## 2023-07-01 DIAGNOSIS — S62313A Displaced fracture of base of third metacarpal bone, left hand, initial encounter for closed fracture: Secondary | ICD-10-CM | POA: Diagnosis not present

## 2023-07-01 DIAGNOSIS — S62311A Displaced fracture of base of second metacarpal bone. left hand, initial encounter for closed fracture: Secondary | ICD-10-CM | POA: Diagnosis not present

## 2023-07-10 ENCOUNTER — Telehealth: Payer: Self-pay

## 2023-07-10 ENCOUNTER — Other Ambulatory Visit (HOSPITAL_COMMUNITY): Payer: Self-pay

## 2023-07-10 NOTE — Telephone Encounter (Signed)
 Pharmacy Patient Advocate Encounter   Received notification from Fax that prior authorization for Aimovig 140MG /ML auto-injectors is required/requested.   Insurance verification completed.   The patient is insured through [employer] .   Per test claim: PA required; PA submitted to above mentioned insurance via CoverMyMeds Key/confirmation #/EOC [id number] Status is pending

## 2023-07-16 ENCOUNTER — Other Ambulatory Visit: Payer: Self-pay | Admitting: Neurology

## 2023-07-16 MED ORDER — QULIPTA 60 MG PO TABS: 60.0000 mg | ORAL_TABLET | Freq: Every day | ORAL | 11 refills | Status: DC

## 2023-07-16 NOTE — Telephone Encounter (Signed)
 Pharmacy Patient Advocate Encounter  Received notification from [facility] that Prior Authorization for Aimovig 140MG /ML auto-injectors has been DENIED.  Full denial letter will be uploaded to the media tab. See denial reason below.   PA #/Case ID/Reference #: PA Case ID #: [id number]

## 2023-07-16 NOTE — Telephone Encounter (Signed)
 Aimovig not approved, we will try patient on Qulipta.

## 2023-07-22 ENCOUNTER — Other Ambulatory Visit: Payer: Self-pay | Admitting: Neurology

## 2023-07-22 DIAGNOSIS — S62313A Displaced fracture of base of third metacarpal bone, left hand, initial encounter for closed fracture: Secondary | ICD-10-CM | POA: Diagnosis not present

## 2023-07-22 DIAGNOSIS — S62311A Displaced fracture of base of second metacarpal bone. left hand, initial encounter for closed fracture: Secondary | ICD-10-CM | POA: Diagnosis not present

## 2023-07-27 NOTE — Telephone Encounter (Signed)
 Just daily, in the morning after waking up.

## 2023-08-11 ENCOUNTER — Encounter: Payer: Self-pay | Admitting: Neurology

## 2023-08-12 DIAGNOSIS — Z87891 Personal history of nicotine dependence: Secondary | ICD-10-CM | POA: Diagnosis not present

## 2023-08-12 DIAGNOSIS — M25562 Pain in left knee: Secondary | ICD-10-CM | POA: Diagnosis not present

## 2023-08-12 DIAGNOSIS — G8929 Other chronic pain: Secondary | ICD-10-CM | POA: Diagnosis not present

## 2023-08-13 ENCOUNTER — Other Ambulatory Visit: Payer: Self-pay | Admitting: Neurology

## 2023-08-13 MED ORDER — PREGABALIN 150 MG PO CAPS: 150.0000 mg | ORAL_CAPSULE | Freq: Two times a day (BID) | ORAL | 5 refills | Status: DC

## 2023-08-18 DIAGNOSIS — R411 Anterograde amnesia: Secondary | ICD-10-CM | POA: Diagnosis not present

## 2023-08-18 DIAGNOSIS — I10 Essential (primary) hypertension: Secondary | ICD-10-CM | POA: Diagnosis not present

## 2023-08-18 DIAGNOSIS — G40919 Epilepsy, unspecified, intractable, without status epilepticus: Secondary | ICD-10-CM | POA: Diagnosis not present

## 2023-08-18 DIAGNOSIS — M25561 Pain in right knee: Secondary | ICD-10-CM | POA: Diagnosis not present

## 2023-08-18 DIAGNOSIS — Z0000 Encounter for general adult medical examination without abnormal findings: Secondary | ICD-10-CM | POA: Diagnosis not present

## 2023-08-18 DIAGNOSIS — Z6825 Body mass index (BMI) 25.0-25.9, adult: Secondary | ICD-10-CM | POA: Diagnosis not present

## 2023-08-18 DIAGNOSIS — G629 Polyneuropathy, unspecified: Secondary | ICD-10-CM | POA: Diagnosis not present

## 2023-08-18 DIAGNOSIS — Z125 Encounter for screening for malignant neoplasm of prostate: Secondary | ICD-10-CM | POA: Diagnosis not present

## 2023-08-18 DIAGNOSIS — F411 Generalized anxiety disorder: Secondary | ICD-10-CM | POA: Diagnosis not present

## 2023-09-25 ENCOUNTER — Encounter: Payer: Self-pay | Admitting: Neurology

## 2023-09-28 ENCOUNTER — Telehealth: Payer: Self-pay

## 2023-09-28 NOTE — Telephone Encounter (Signed)
 Call to wife PAM, she states that for the last week, patient has had constant pain in his right temple. Any loud noises bother him and he will grab his ears. She denies any known seizures. She reports medication compliance and says the imitrex  dulls the pain but doesn't take it away. Patient was not with her and she is unsure if it's a headache, she knows he has constant reports of pain in right temple area. Advised I would send to Dr. Samara Crest to review.

## 2023-09-30 NOTE — Telephone Encounter (Signed)
 Add him to the cancellation list so we can address his migraines.

## 2023-10-12 DIAGNOSIS — G8929 Other chronic pain: Secondary | ICD-10-CM | POA: Diagnosis not present

## 2023-10-12 DIAGNOSIS — Z87891 Personal history of nicotine dependence: Secondary | ICD-10-CM | POA: Diagnosis not present

## 2023-10-12 DIAGNOSIS — M2550 Pain in unspecified joint: Secondary | ICD-10-CM | POA: Diagnosis not present

## 2023-10-12 DIAGNOSIS — Z716 Tobacco abuse counseling: Secondary | ICD-10-CM | POA: Diagnosis not present

## 2023-10-12 DIAGNOSIS — M25562 Pain in left knee: Secondary | ICD-10-CM | POA: Diagnosis not present

## 2023-11-20 ENCOUNTER — Other Ambulatory Visit (INDEPENDENT_AMBULATORY_CARE_PROVIDER_SITE_OTHER): Payer: Self-pay | Admitting: Gastroenterology

## 2023-11-20 DIAGNOSIS — G8929 Other chronic pain: Secondary | ICD-10-CM

## 2023-12-17 ENCOUNTER — Encounter: Payer: Self-pay | Admitting: Neurology

## 2023-12-17 ENCOUNTER — Ambulatory Visit: Admitting: Neurology

## 2023-12-17 VITALS — BP 114/81 | HR 89 | Wt 162.5 lb

## 2023-12-17 DIAGNOSIS — G43701 Chronic migraine without aura, not intractable, with status migrainosus: Secondary | ICD-10-CM | POA: Diagnosis not present

## 2023-12-17 DIAGNOSIS — G40909 Epilepsy, unspecified, not intractable, without status epilepticus: Secondary | ICD-10-CM

## 2023-12-17 DIAGNOSIS — Z5181 Encounter for therapeutic drug level monitoring: Secondary | ICD-10-CM | POA: Diagnosis not present

## 2023-12-17 MED ORDER — PREGABALIN 150 MG PO CAPS: 150.0000 mg | ORAL_CAPSULE | Freq: Three times a day (TID) | ORAL | 5 refills | Status: AC

## 2023-12-17 MED ORDER — SUMATRIPTAN SUCCINATE 100 MG PO TABS: 100.0000 mg | ORAL_TABLET | ORAL | 11 refills | Status: AC | PRN

## 2023-12-17 NOTE — Patient Instructions (Signed)
 Increase Lyrica  to 150 mg 3 times daily Increase sumatriptan  to 100 mg as needed Continue your other medications Will obtain Depakote  level with CMP and CBC Return 1 year or sooner if worse

## 2023-12-17 NOTE — Progress Notes (Signed)
 [facility]  PATIENT: Joshua Hale. DOB: 1971/02/14  REQUESTING CLINICIAN: Orpha Yancey LABOR, MD HISTORY FROM: Patient and wife  REASON FOR VISIT: Seizure/Migraines headaches    HISTORICAL  CHIEF COMPLAINT:  Chief Complaint  Patient presents with   Follow-up   INTERVAL HISTORY 12/17/2023 Harman presents today for follow-up, last visit was a year ago, since then he has been doing well, no seizure or seizure like activity.  He was restarted on Depakote  due to worsening mood once we stopped the depakote , latest LFTs have been normal.  He tells me insurance stopped paying for the Aimovig  and he did have a return of his headaches.  Tells me that pregabalin  has been helping and sumatriptan  does not take the pain away.  Last migraine was 3 days ago.   INTERVAL HISTORY 12/23/2022:  Greysin presents today for follow-up, he is accompanied by wife.  Last visit was in March at that time we checked the LFT and were still elevated.  We have decided to discontinue Depakote  and start him on zonisamide  300 mg nightly.  While being off the Depakote  and on zonisamide , he has not had any seizures but had worsening mood and irritability.  He reports increase depression, crying spell, being irritable to her seven months grandchild which makes him very, very upset and sad.  He does want to go back on Depakote  because of his mood.  But again no seizure or seizure activity.  Reports the headaches are well-controlled on Aimovig .   INTERVAL HISTORY 06/16/2022 Taison presents today for follow-up, he is accompanied by wife.  Last visit was in August.  At that time we increased Depakote  to 500 mg 3 times daily and check his LFTs.  His Depakote  level was 71 and his LFTs were not elevated.   He also completed a routine EEG which was normal. Since then wife reported breakthrough seizures, he had a total of 4-5 seizures, some required Nayzilam .  They did not go to the hospital and they did not contact his office  to let us  know.  Currently his main complaint is headaches.  He reported the week prior to his next Aimovig  injection is due he will have severe headaches.  He is not on any abortive medication.  He usually take Advil  for the pain. He reports having this pain for the past 3 days.  He also reports running out of Doxepin , used it as a sleep aid and needs refills. No other complaints.    HISTORY OF PRESENT ILLNESS:  This is a 53 year old man with PMHx including migraines headaches on Aimovig , Seizure disorder that started last November, and chronic pain syndrome who is presenting as a transfer of care. Patient reports that seizures started last November, described as generalized convulsion with drooling, tongue biting and urinary incontinence. He reports frequency improved after started Depakote  but still having about one seizure per month. Reports history of head trauma including concussion, history of alcohol abuse but no other seizure risk factors.    Handedness: Rght handed   Onset: November of last year  Seizure Type: Generalized convulsion  Current frequency: Once a month   Any injuries from seizures: Tongue biting  Seizure risk factors: head trauma, quit drinking 7 months ago,   Previous ASMs: Depakote , Zonisamide    Currenty ASMs: Depakote  500 mg three times daily   ASMs side effects: Denies   Brain Images: Normal Brain MRI   Previous EEGs: Normal EEG    OTHER MEDICAL CONDITIONS:  Chronic pain syndrome, Migraines  REVIEW OF SYSTEMS: Full 14 system review of systems performed and negative with exception of: As noted in the HPI    HOME MEDICATIONS: Outpatient Medications Prior to Visit  Medication Sig Dispense Refill   diazepam (VALIUM) 5 MG tablet Take 5 mg by mouth 2 (two) times daily.     dicyclomine  (BENTYL ) 10 MG capsule TAKE 1 CAPSULE BY MOUTH EVERY 12 HOURS AS NEEDED FOR ABDOMINAL FOLDS SPASMS/PAIN 60 capsule 5   divalproex  (DEPAKOTE ) 500 MG DR tablet Take 1 tablet  (500 mg total) by mouth 3 (three) times daily. 270 tablet 3   doxepin  (SINEQUAN ) 10 MG capsule TAKE ONE CAPSULE BY MOUTH AT BEDTIME 30 capsule 11   Magnesium  400 MG TABS Take 400 mg by mouth daily.     ondansetron  (ZOFRAN -ODT) 4 MG disintegrating tablet dissolve ONE TABLET BY MOUTH EVERY 8 HOURS AS NEEDED FOR NAUSEA AND VOMITING 30 tablet 1   potassium chloride SA (KLOR-CON M) 20 MEQ tablet Take 20 mEq by mouth daily.     pregabalin  (LYRICA ) 150 MG capsule Take 1 capsule (150 mg total) by mouth 2 (two) times daily. 60 capsule 5   SUMAtriptan  (IMITREX ) 50 MG tablet TAKE ONE TABLET BY MOUTH EVERY 2 HOURS AS NEEDED MIGRAINE. MAY REPEAT IN TWO HOURS IF HEADACHE PERSISTS OR RECURS 10 tablet 6   busPIRone (BUSPAR) 15 MG tablet Take 15 mg by mouth 2 (two) times daily. (Patient not taking: Reported on 12/17/2023)     esomeprazole  (NEXIUM ) 40 MG capsule TAKE ONE CAPSULE BY MOUTH TWICE DAILY BEFORE A MEAL (Patient not taking: Reported on 12/17/2023) 60 capsule 1   NAYZILAM  5 MG/0.1ML SOLN Place 5 mg into both nostrils as needed (Seizures lasting more than 5 mintues). (Patient not taking: Reported on 12/17/2023) 5 each 5   sucralfate  (CARAFATE ) 1 GM/10ML suspension TAKE TWO TEASPOONSFUL (10ML) BY MOUTH FOUR TIMES DAILY WITH MEALS AND AT BEDTIME (Patient not taking: Reported on 12/17/2023) 420 mL 1   No facility-administered medications prior to visit.    PAST MEDICAL HISTORY: Past Medical History:  Diagnosis Date   Crohn disease (HCC)    Depression    Heart murmur    Medical history non-contributory    Post traumatic stress disorder (PTSD)    Seizures (HCC)     PAST SURGICAL HISTORY: Past Surgical History:  Procedure Laterality Date   ABDOMINAL SURGERY     stents   BIOPSY  06/21/2021   Procedure: BIOPSY;  Surgeon: Eartha Angelia Sieving, MD;  [facility]: [facility];  Service: Gastroenterology;;   BIOPSY  12/10/2021   Procedure: BIOPSY;  Surgeon: Eartha Angelia Sieving, MD;  [facility];  Service: Gastroenterology;;   CHOLECYSTECTOMY     COLONOSCOPY WITH PROPOFOL  N/A 06/21/2021   Procedure: COLONOSCOPY WITH PROPOFOL ;  Surgeon: Eartha Angelia Sieving, MD;  Location: [facility];  Service: Gastroenterology;  Laterality: N/A;   ESOPHAGOGASTRODUODENOSCOPY (EGD) WITH PROPOFOL  N/A 06/21/2021   Procedure: ESOPHAGOGASTRODUODENOSCOPY (EGD) WITH PROPOFOL ;  Surgeon: Eartha Angelia Sieving, MD;  Location: [facility];  Service: Gastroenterology;  Laterality: N/A;  945   ESOPHAGOGASTRODUODENOSCOPY (EGD) WITH PROPOFOL  N/A 12/10/2021   Procedure: ESOPHAGOGASTRODUODENOSCOPY (EGD) WITH PROPOFOL ;  Surgeon: Eartha Angelia Sieving, MD;  [facility]: [facility];  Service: Gastroenterology;  Laterality: N/A;  2:00 ASA 2   KNEE SURGERY     POLYPECTOMY  06/21/2021   Procedure: POLYPECTOMY;  Surgeon: Eartha Angelia Sieving, MD;  Location: [facility];  Service: Gastroenterology;;  thumb surgery      FAMILY HISTORY: Family History  Problem Relation Age of Onset   Hypertension Mother    Heart failure Mother    COPD Mother     SOCIAL HISTORY: Social History   Socioeconomic History   Marital status: Married    Spouse name: Not on file   Number of children: Not on file   Years of education: Not on file   Highest education level: Not on file  Occupational History   Not on file  Tobacco Use   Smoking status: Every Day    Current packs/day: 2.00    Types: Cigarettes   Smokeless tobacco: Never  Vaping Use   Vaping status: Never Used  Substance and Sexual Activity   Alcohol use: Not Currently    Alcohol/week: 84.0 standard drinks of alcohol    Types: 84 Cans of beer per week    Comment: nothing since January 2023   Drug use: Yes    Frequency: 2.0 times per week    Types: Marijuana    Comment: tuesday last   Sexual activity: Yes  Other Topics Concern   Not on file  Social History Narrative   Right handed    Caffeine-none   Social Drivers of Health    Financial Resource Strain: Not on file  Food Insecurity: Not on file  Transportation Needs: Not on file  Physical Activity: Not on file  Stress: Not on file  Social Connections: Not on file  Intimate Partner Violence: Not on file    PHYSICAL EXAM  GENERAL EXAM/CONSTITUTIONAL: Vitals:  Vitals:   12/17/23 1332  BP: 114/81  Pulse: 89  SpO2: 96%  Weight: 162 lb 8 oz (73.7 kg)     Body mass index is 26.23 kg/m. Wt Readings from Last 3 Encounters:  12/17/23 162 lb 8 oz (73.7 kg)  12/23/22 136 lb (61.7 kg)  07/17/22 152 lb (68.9 kg)   Patient is in no distress; well developed, poorly groomed and disheveled, neck is supple. Appears anxious and crying.  MUSCULOSKELETAL: Gait, strength, tone, movements noted in Neurologic exam below  NEUROLOGIC: MENTAL STATUS:      No data to display         awake, alert, oriented to person, place and time recent and remote memory intact normal attention and concentration language fluent, comprehension intact, naming intact fund of knowledge appropriate  CRANIAL NERVE:  2nd, 3rd, 4th, 6th - visual fields full to confrontation, extraocular muscles intact, no nystagmus 5th - facial sensation symmetric 7th - facial strength symmetric 8th - hearing intact 9th - palate elevates symmetrically, uvula midline 11th - shoulder shrug symmetric 12th - tongue protrusion midline  MOTOR:  normal bulk and tone, full strength in the BUE, BLE  SENSORY:  normal and symmetric to light touch  COORDINATION:  finger-nose-finger, fine finger movements normal  GAIT/STATION:  Antalgic gait. History of left knee pain and previous surgeries     DIAGNOSTIC DATA (LABS, IMAGING, TESTING) - I reviewed patient records, labs, notes, testing and imaging myself where available.  Lab Results  Component Value Date   WBC 7.2 06/16/2022   HGB 14.7 06/16/2022   HCT 43.5 06/16/2022   MCV 97 06/16/2022   PLT 216 06/16/2022      Component Value  Date/Time   NA 139 06/02/2023 1535   K 4.0 06/02/2023 1535   CL 96 06/02/2023 1535   CO2 26 06/02/2023 1535   GLUCOSE 81 06/02/2023 1535   GLUCOSE 89 11/23/2010  1913   BUN 13 06/02/2023 1535   CREATININE 0.99 06/02/2023 1535   CALCIUM 9.3 06/02/2023 1535   PROT 7.1 06/02/2023 1535   ALBUMIN 4.4 06/02/2023 1535   AST 28 06/02/2023 1535   ALT 41 06/02/2023 1535   ALKPHOS 213 (H) 06/02/2023 1535   BILITOT 0.5 06/02/2023 1535   GFRNONAA >60 11/23/2010 1913   GFRAA >60 11/23/2010 1913   No results found for: CHOL, HDL, LDLCALC, LDLDIRECT, TRIG No results found for: HGBA1C No results found for: VITAMINB12 No results found for: TSH  MRI Brain 09/18/2021 No evidence of recent infarction, hemorrhage, or mass. No abnormal enhancement.  Routine EEG 12/17/2021 This is a normal EEG recording in the waking and drowsy state. No evidence of interictal epileptiform discharges seen. A normal EEG does not exclude a diagnosis of epilepsy.    ASSESSMENT AND PLAN  53 y.o. year old male  with seizure disorder and migraines here for follow up.  He is on Depakote  500 mg TID and has not had any additional seizures, his latest LFTs were normal.  His mood is better.  Will plan to check LFTs and if normal will continue Depakote  500 mg 3 times daily. For his headaches, Aimovig  was no longer approved by insurance, he tells me that pregabalin  150 mg twice daily helps with the pain but does not overall control the headaches, will increase to pregabalin  150 mg 3 times daily.  He is also on sumatriptan  50 mg, will increase to 100 mg as needed for headaches.  Advised him to contact me for any additional question or concerns otherwise I will see him in 1 year for follow-up or sooner if worse.   1. Seizure disorder (HCC)   2. Therapeutic drug monitoring   3. Chronic migraine without aura with status migrainosus, not intractable      Patient Instructions  Increase Lyrica  to 150 mg 3 times  daily Increase sumatriptan  to 100 mg as needed Continue your other medications Will obtain Depakote  level with CMP and CBC Return 1 year or sooner if worse   Per [place]  DMV statutes, patients with seizures are not allowed to drive until they have been seizure-free for six months.  Other recommendations include using caution when using heavy equipment or power tools. Avoid working on ladders or at heights. Take showers instead of baths.  Do not swim alone.  Ensure the water temperature is not too high on the home water heater. Do not go swimming alone. Do not lock yourself in a room alone (i.e. bathroom). When caring for infants or small children, sit down when holding, feeding, or changing them to minimize risk of injury to the child in the event you have a seizure. Maintain good sleep hygiene. Avoid alcohol.  Also recommend adequate sleep, hydration, good diet and minimize stress.   During the Seizure  - First, ensure adequate ventilation and place patients on the floor on their left side  Loosen clothing around the neck and ensure the airway is patent. If the patient is clenching the teeth, do not force the mouth open with any object as this can cause severe damage - Remove all items from the surrounding that can be hazardous. The patient may be oblivious to what's happening and may not even know what he or she is doing. If the patient is confused and wandering, either gently guide him/her away and block access to outside areas - Reassure the individual and be comforting - Call 911. In most cases,  the seizure ends before EMS arrives. However, there are cases when seizures may last over 3 to 5 minutes. Or the individual may have developed breathing difficulties or severe injuries. If a pregnant patient or a person with diabetes develops a seizure, it is prudent to call an ambulance. - Finally, if the patient does not regain full consciousness, then call EMS. Most patients will remain  confused for about 45 to 90 minutes after a seizure, so you must use judgment in calling for help. - Avoid restraints but make sure the patient is in a bed with padded side rails - Place the individual in a lateral position with the neck slightly flexed; this will help the saliva drain from the mouth and prevent the tongue from falling backward - Remove all nearby furniture and other hazards from the area - Provide verbal assurance as the individual is regaining consciousness - Provide the patient with privacy if possible - Call for help and start treatment as ordered by the caregiver   After the Seizure (Postictal Stage)  After a seizure, most patients experience confusion, fatigue, muscle pain and/or a headache. Thus, one should permit the individual to sleep. For the next few days, reassurance is essential. Being calm and helping reorient the person is also of importance.  Most seizures are painless and end spontaneously. Seizures are not harmful to others but can lead to complications such as stress on the lungs, brain and the heart. Individuals with prior lung problems may develop labored breathing and respiratory distress.     Orders Placed This Encounter  Procedures   Valproic Acid  Level   CMP   CBC with Differential/Platelets    Meds ordered this encounter  Medications   SUMAtriptan  (IMITREX ) 100 MG tablet    Sig: Take 1 tablet (100 mg total) by mouth every 2 (two) hours as needed for migraine. May repeat in 2 hours if headache persists or recurs.    Dispense:  10 tablet    Refill:  11   pregabalin  (LYRICA ) 150 MG capsule    Sig: Take 1 capsule (150 mg total) by mouth in the morning, at noon, and at bedtime.    Dispense:  90 capsule    Refill:  5    Return in about 1 year (around 12/16/2024).    Pastor Falling, MD 12/17/2023, 2:08 PM  [facility] [address] [place], [place] [address] [phone] )-

## 2023-12-18 LAB — COMPREHENSIVE METABOLIC PANEL WITH GFR
ALT: 26 IU/L (ref 0–44)
AST: 28 IU/L (ref 0–40)
Albumin: 4 g/dL (ref 3.8–4.9)
Alkaline Phosphatase: 148 IU/L — ABNORMAL HIGH (ref 44–121)
BUN/Creatinine Ratio: 18 (ref 9–20)
BUN: 18 mg/dL (ref 6–24)
Bilirubin Total: <0.2 mg/dL (ref 0.0–1.2)
CO2: 27 mmol/L (ref 20–29)
Calcium: 9.3 mg/dL (ref 8.7–10.2)
Chloride: 101 mmol/L (ref 96–106)
Creatinine, Ser: 1.01 mg/dL (ref 0.76–1.27)
Globulin, Total: 2.3 g/dL (ref 1.5–4.5)
Glucose: 85 mg/dL (ref 70–99)
Potassium: 4.8 mmol/L (ref 3.5–5.2)
Sodium: 142 mmol/L (ref 134–144)
Total Protein: 6.3 g/dL (ref 6.0–8.5)
eGFR: 89 mL/min/1.73 (ref >59)

## 2023-12-18 LAB — CBC WITH DIFFERENTIAL/PLATELET
Basophils Absolute: 0.1 x10E3/uL (ref 0.0–0.2)
Basos: 1 %
EOS (ABSOLUTE): 0.5 x10E3/uL — ABNORMAL HIGH (ref 0.0–0.4)
Eos: 6 %
Hematocrit: 40.3 % (ref 37.5–51.0)
Hemoglobin: 12.6 g/dL — ABNORMAL LOW (ref 13.0–17.7)
Immature Grans (Abs): 0 x10E3/uL (ref 0.0–0.1)
Immature Granulocytes: 0 %
Lymphocytes Absolute: 2.4 x10E3/uL (ref 0.7–3.1)
Lymphs: 27 %
MCH: 29.4 pg (ref 26.6–33.0)
MCHC: 31.3 g/dL — ABNORMAL LOW (ref 31.5–35.7)
MCV: 94 fL (ref 79–97)
Monocytes Absolute: 0.9 x10E3/uL (ref 0.1–0.9)
Monocytes: 10 %
Neutrophils Absolute: 4.9 x10E3/uL (ref 1.4–7.0)
Neutrophils: 56 %
Platelets: 319 x10E3/uL (ref 150–450)
RBC: 4.29 x10E6/uL (ref 4.14–5.80)
RDW: 16.6 % — ABNORMAL HIGH (ref 11.6–15.4)
WBC: 8.8 x10E3/uL (ref 3.4–10.8)

## 2023-12-18 LAB — VALPROIC ACID LEVEL: Valproic Acid Lvl: 48 ug/mL — ABNORMAL LOW (ref 50–100)

## 2023-12-21 ENCOUNTER — Ambulatory Visit: Payer: Self-pay | Admitting: Neurology

## 2023-12-23 DIAGNOSIS — Z6824 Body mass index (BMI) 24.0-24.9, adult: Secondary | ICD-10-CM | POA: Diagnosis not present

## 2023-12-23 DIAGNOSIS — F411 Generalized anxiety disorder: Secondary | ICD-10-CM | POA: Diagnosis not present

## 2023-12-23 DIAGNOSIS — J301 Allergic rhinitis due to pollen: Secondary | ICD-10-CM | POA: Diagnosis not present

## 2023-12-23 DIAGNOSIS — G40919 Epilepsy, unspecified, intractable, without status epilepticus: Secondary | ICD-10-CM | POA: Diagnosis not present

## 2023-12-23 DIAGNOSIS — L720 Epidermal cyst: Secondary | ICD-10-CM | POA: Diagnosis not present

## 2023-12-23 DIAGNOSIS — G629 Polyneuropathy, unspecified: Secondary | ICD-10-CM | POA: Diagnosis not present

## 2023-12-23 DIAGNOSIS — M25561 Pain in right knee: Secondary | ICD-10-CM | POA: Diagnosis not present

## 2023-12-23 DIAGNOSIS — I10 Essential (primary) hypertension: Secondary | ICD-10-CM | POA: Diagnosis not present

## 2024-01-27 ENCOUNTER — Encounter (INDEPENDENT_AMBULATORY_CARE_PROVIDER_SITE_OTHER): Payer: Self-pay | Admitting: Gastroenterology

## 2024-03-23 ENCOUNTER — Other Ambulatory Visit: Payer: Self-pay | Admitting: Neurology

## 2024-03-23 DIAGNOSIS — M25561 Pain in right knee: Secondary | ICD-10-CM | POA: Diagnosis not present

## 2024-03-23 DIAGNOSIS — Z6823 Body mass index (BMI) 23.0-23.9, adult: Secondary | ICD-10-CM | POA: Diagnosis not present

## 2024-03-23 DIAGNOSIS — F411 Generalized anxiety disorder: Secondary | ICD-10-CM | POA: Diagnosis not present

## 2024-03-23 DIAGNOSIS — G40919 Epilepsy, unspecified, intractable, without status epilepticus: Secondary | ICD-10-CM | POA: Diagnosis not present

## 2024-03-23 DIAGNOSIS — J301 Allergic rhinitis due to pollen: Secondary | ICD-10-CM | POA: Diagnosis not present

## 2024-03-23 DIAGNOSIS — I10 Essential (primary) hypertension: Secondary | ICD-10-CM | POA: Diagnosis not present

## 2024-03-23 DIAGNOSIS — G629 Polyneuropathy, unspecified: Secondary | ICD-10-CM | POA: Diagnosis not present

## 2024-03-24 NOTE — Telephone Encounter (Signed)
 Last seen on 12/17/23 Follow up scheduled on 12/22/24

## 2024-12-22 ENCOUNTER — Ambulatory Visit: Admitting: Neurology
